# Patient Record
Sex: Male | Born: 1960 | Race: Black or African American | Hispanic: No | Marital: Married | Smoking: Never smoker
Health system: Southern US, Community
[De-identification: ages and names within clinical notes are randomized; demographics above are authoritative.]

## PROBLEM LIST (undated history)

## (undated) DIAGNOSIS — C801 Malignant (primary) neoplasm, unspecified: Secondary | ICD-10-CM

## (undated) DIAGNOSIS — I10 Essential (primary) hypertension: Secondary | ICD-10-CM

## (undated) DIAGNOSIS — E785 Hyperlipidemia, unspecified: Secondary | ICD-10-CM

## (undated) DIAGNOSIS — M109 Gout, unspecified: Secondary | ICD-10-CM

## (undated) DIAGNOSIS — M1990 Unspecified osteoarthritis, unspecified site: Secondary | ICD-10-CM

## (undated) DIAGNOSIS — Z86718 Personal history of other venous thrombosis and embolism: Secondary | ICD-10-CM

## (undated) HISTORY — DX: Essential (primary) hypertension: I10

## (undated) HISTORY — DX: Gout, unspecified: M10.9

## (undated) HISTORY — PX: KNEE ARTHROSCOPY: SUR90

## (undated) HISTORY — DX: Hyperlipidemia, unspecified: E78.5

---

## 2004-07-24 ENCOUNTER — Emergency Department: Payer: Self-pay | Admitting: General Practice

## 2006-06-06 ENCOUNTER — Emergency Department: Payer: Self-pay | Admitting: Emergency Medicine

## 2008-11-05 ENCOUNTER — Emergency Department: Payer: Self-pay | Admitting: Emergency Medicine

## 2009-07-07 ENCOUNTER — Emergency Department: Payer: Self-pay | Admitting: Internal Medicine

## 2009-10-21 ENCOUNTER — Emergency Department: Payer: Self-pay | Admitting: Emergency Medicine

## 2011-02-06 ENCOUNTER — Encounter: Payer: Self-pay | Admitting: Internal Medicine

## 2011-02-24 ENCOUNTER — Ambulatory Visit (AMBULATORY_SURGERY_CENTER): Payer: 59 | Admitting: *Deleted

## 2011-02-24 VITALS — Ht 69.0 in | Wt 233.0 lb

## 2011-02-24 DIAGNOSIS — Z1211 Encounter for screening for malignant neoplasm of colon: Secondary | ICD-10-CM

## 2011-02-24 MED ORDER — PEG-KCL-NACL-NASULF-NA ASC-C 100 G PO SOLR: ORAL | Status: DC

## 2011-02-24 NOTE — Progress Notes (Signed)
PCP Ronnald Collum at [facility],  [address] [place], [place]  [phone]

## 2011-02-25 ENCOUNTER — Encounter: Payer: Self-pay | Admitting: Internal Medicine

## 2011-03-03 ENCOUNTER — Ambulatory Visit (AMBULATORY_SURGERY_CENTER): Payer: 59 | Admitting: Internal Medicine

## 2011-03-03 ENCOUNTER — Encounter: Payer: Self-pay | Admitting: Internal Medicine

## 2011-03-03 VITALS — BP 122/81 | HR 54 | Temp 97.1°F | Resp 20 | Ht 69.0 in | Wt 235.0 lb

## 2011-03-03 DIAGNOSIS — K5730 Diverticulosis of large intestine without perforation or abscess without bleeding: Secondary | ICD-10-CM

## 2011-03-03 DIAGNOSIS — D126 Benign neoplasm of colon, unspecified: Secondary | ICD-10-CM

## 2011-03-03 DIAGNOSIS — Z1211 Encounter for screening for malignant neoplasm of colon: Secondary | ICD-10-CM

## 2011-03-03 MED ORDER — SODIUM CHLORIDE 0.9 % IV SOLN: 500.0000 mL | INTRAVENOUS | Status: DC

## 2011-03-03 NOTE — Patient Instructions (Signed)
FOLLOW DISCHARGE INSTRUCTIONS (BLUE & GREEN SHEETS)   INFORMATION GIVEN ON POLYPS, DIVERTICULOSIS, & HIGH FIBER DIET.

## 2011-03-03 NOTE — Progress Notes (Signed)
PT'S HEART RATE IS IN THE 50'S. PT STATES HE NORMALLY RUNS IN THE 60-70'S HE THOUGHT. NO PAIN OR DISCOMFORT. STATES HAS BEEN EXERCISING A LOT LATELY TO TRY TO HELP DECREASE HIS BLOOD PRESSURE. EWM  02 SAT DECREASED TO 88%- O2 INCREASED TO 3L. REPOSITIONED HEAD REMOVED PILLOW. SAT REMAINED 88 SO INCREASED TO 4L. REMOVED PILLOW, HAD PT TAKE DEEP BREATHES AND MD AWARE WITHOUT FURTHER ORDERS. EWM  SAT UP TO 91-93% ON 4L O2. EWM

## 2011-03-04 ENCOUNTER — Telehealth: Payer: Self-pay | Admitting: *Deleted

## 2011-03-04 NOTE — Telephone Encounter (Signed)
No answer on number left. Was voice mail that identified pt by first and last name so left message to call if problems or questions. EWM

## 2011-03-07 ENCOUNTER — Encounter: Payer: Self-pay | Admitting: Internal Medicine

## 2011-06-19 ENCOUNTER — Emergency Department: Payer: Self-pay | Admitting: Emergency Medicine

## 2012-06-23 ENCOUNTER — Emergency Department: Payer: Self-pay | Admitting: Emergency Medicine

## 2012-06-23 LAB — BASIC METABOLIC PANEL
Anion Gap: 9 (ref 7–16)
Calcium, Total: 9.6 mg/dL (ref 8.5–10.1)
Co2: 25 mmol/L (ref 21–32)
EGFR (African American): >60
EGFR (Non-African Amer.): >60
Glucose: 108 mg/dL — ABNORMAL HIGH (ref 65–99)
Osmolality: 282 (ref 275–301)

## 2012-06-23 LAB — URINALYSIS, COMPLETE
Bacteria: NONE SEEN
Glucose,UR: NEGATIVE mg/dL (ref 0–75)
Nitrite: NEGATIVE
Ph: 5 (ref 4.5–8.0)
Protein: 100
RBC,UR: 1 /HPF (ref 0–5)
Specific Gravity: 1.026 (ref 1.003–1.030)
Squamous Epithelial: NONE SEEN
WBC UR: 1 /HPF (ref 0–5)

## 2012-06-23 LAB — CBC
HCT: 47 % (ref 40.0–52.0)
HGB: 16.1 g/dL (ref 13.0–18.0)
MCHC: 34.2 g/dL (ref 32.0–36.0)
MCV: 84 fL (ref 80–100)
Platelet: 240 10*3/uL (ref 150–440)
RBC: 5.59 10*6/uL (ref 4.40–5.90)
RDW: 15.7 % — ABNORMAL HIGH (ref 11.5–14.5)

## 2013-03-28 ENCOUNTER — Emergency Department: Payer: Self-pay | Admitting: Emergency Medicine

## 2013-03-28 LAB — CBC
HCT: 45.8 % (ref 40.0–52.0)
HGB: 15.8 g/dL (ref 13.0–18.0)
MCH: 28.8 pg (ref 26.0–34.0)
MCV: 84 fL (ref 80–100)
Platelet: 245 10*3/uL (ref 150–440)
RBC: 5.47 10*6/uL (ref 4.40–5.90)
RDW: 15.5 % — ABNORMAL HIGH (ref 11.5–14.5)

## 2013-03-28 LAB — BASIC METABOLIC PANEL
BUN: 15 mg/dL (ref 7–18)
Chloride: 107 mmol/L (ref 98–107)
Co2: 23 mmol/L (ref 21–32)
EGFR (Non-African Amer.): >60
Osmolality: 276 (ref 275–301)
Potassium: 3.7 mmol/L (ref 3.5–5.1)

## 2013-03-28 LAB — URIC ACID: Uric Acid: 8.2 mg/dL — ABNORMAL HIGH (ref 3.5–7.2)

## 2013-04-19 ENCOUNTER — Ambulatory Visit (INDEPENDENT_AMBULATORY_CARE_PROVIDER_SITE_OTHER): Payer: 59

## 2013-04-19 ENCOUNTER — Encounter: Payer: Self-pay | Admitting: Podiatry

## 2013-04-19 ENCOUNTER — Ambulatory Visit (INDEPENDENT_AMBULATORY_CARE_PROVIDER_SITE_OTHER): Payer: 59 | Admitting: Podiatry

## 2013-04-19 VITALS — BP 147/101 | HR 70 | Temp 97.6°F | Resp 16 | Ht 69.0 in | Wt 234.2 lb

## 2013-04-19 DIAGNOSIS — M779 Enthesopathy, unspecified: Secondary | ICD-10-CM

## 2013-04-19 DIAGNOSIS — R52 Pain, unspecified: Secondary | ICD-10-CM

## 2013-04-19 DIAGNOSIS — R609 Edema, unspecified: Secondary | ICD-10-CM

## 2013-04-19 DIAGNOSIS — M109 Gout, unspecified: Secondary | ICD-10-CM

## 2013-04-19 MED ORDER — METHYLPREDNISOLONE 4 MG PO KIT: PACK | ORAL | Status: DC

## 2013-04-19 MED ORDER — TRIAMCINOLONE ACETONIDE 10 MG/ML IJ SUSP
5.0000 mg | Freq: Once | INTRAMUSCULAR | Status: AC
  Administered 2013-04-19: 5 mg via INTRA_ARTICULAR

## 2013-04-19 NOTE — Progress Notes (Signed)
N HURT/SWELL L LEFT FOOT THRU-OUT D 4 WEEKS O SUDDEN C WORSE A WALKING T TAKING INDOCIN, ICE PACK, SOAKS IN EPSON SALT

## 2013-04-19 NOTE — Progress Notes (Signed)
Subjective:     Patient ID: Kevin Lowery, male   DOB: February 28, 1961, 52 y.o.   MRN: [id number]  Foot Pain   patient states that he has had swelling in his left ankle which was moved into his foot. He went to the emergency room and they gave him indomethacin. He stated that he did have an attack like this one year ago which got better with indomethacin. He also had blood work done indicating elevated uric acid.   Review of Systems  All other systems reviewed and are negative.       Objective:   Physical Exam  Nursing note and vitals reviewed. Cardiovascular: Intact distal pulses.    Neurological found to be intact bilateral. Moderate edema noted in the left foot and ankle. Negative Homans sign was noted left upper and lower leg. No indications of systemic signs of clots. Tenderness noted in the sinus tarsi left and into the lateral ankle gutter. Muscle strength was reduced left side. The foot was warm bilateral. Range of motion reduced left side but appears to be splinting because of the pain that he is in.    Assessment:     Probable acute inflammatory condition left ankle. Maybe related to gout or other inflammatory condition. Cannot rule out clot but no clinical signs currently.    Plan:     H&P performed. X-ray reviewed with the patient. Today I injected the sinus tarsi and also dispensed air fracture walker to immobilize the foot and ankle. I gave him strict instructions of any swelling should occur in his leg or if he should develop any shortness of breath or pain to let us know immediately. Placed on a Medrol Dosepak. Reappoint 2 weeks

## 2013-04-19 NOTE — Patient Instructions (Signed)
D\c indomethacin and begin steroid pack. Wear boot as much as possible

## 2013-05-03 ENCOUNTER — Encounter: Payer: Self-pay | Admitting: Podiatry

## 2013-05-03 ENCOUNTER — Ambulatory Visit (INDEPENDENT_AMBULATORY_CARE_PROVIDER_SITE_OTHER): Payer: 59 | Admitting: Podiatry

## 2013-05-03 VITALS — BP 136/88 | HR 84 | Resp 16 | Ht 69.0 in | Wt 235.0 lb

## 2013-05-03 DIAGNOSIS — M069 Rheumatoid arthritis, unspecified: Secondary | ICD-10-CM

## 2013-05-03 DIAGNOSIS — M109 Gout, unspecified: Secondary | ICD-10-CM

## 2013-05-03 DIAGNOSIS — M779 Enthesopathy, unspecified: Secondary | ICD-10-CM

## 2013-05-03 MED ORDER — METHYLPREDNISOLONE 4 MG PO KIT: PACK | ORAL | Status: DC

## 2013-05-03 MED ORDER — TRIAMCINOLONE ACETONIDE 10 MG/ML IJ SUSP
5.0000 mg | Freq: Once | INTRAMUSCULAR | Status: AC
  Administered 2013-05-03: 5 mg via INTRA_ARTICULAR

## 2013-05-03 NOTE — Progress Notes (Signed)
Subjective:     Patient ID: Kevin Lowery, male   DOB: 12-11-1960, 52 y.o.   MRN: [id number]  HPI patient states my foot still hurts not as badly as it did but still sore and also my right elbow is tender and my big toe joint is tender. Left foot is where most symptoms have been   Review of Systems  All other systems reviewed and are negative.       Objective:   Physical Exam  Nursing note and vitals reviewed. Constitutional: He appears well-developed and well-nourished.  Cardiovascular: Intact distal pulses.   Musculoskeletal: Normal range of motion.  Neurological: He is alert.  Skin: Skin is warm.   patient continues to have discomfort in the lateral side of the left ankle mostly in the ankle gutter with the sinus tarsi been improved. Mild edema noted in the forefoot and edema of the right elbow noted     Assessment:     Cannot judge whether this may be systemic gout or possible arthritic condition versus localized inflammation    Plan:     Patient will begin allopurinol and was placed on a Nutter six-day steroid dose pack. I am sending for full arthritic profile do 2 symptoms the way they are I injected the lateral ankle gutter 3 mg Kenalog 5 mg Xylocaine Marcaine mixture

## 2013-05-31 ENCOUNTER — Ambulatory Visit: Payer: 59 | Admitting: Podiatry

## 2013-12-23 ENCOUNTER — Emergency Department: Payer: Self-pay | Admitting: Emergency Medicine

## 2013-12-23 LAB — COMPREHENSIVE METABOLIC PANEL
ALBUMIN: 3.8 g/dL (ref 3.4–5.0)
Alkaline Phosphatase: 65 U/L
Anion Gap: 8 (ref 7–16)
BUN: 14 mg/dL (ref 7–18)
Bilirubin,Total: 0.9 mg/dL (ref 0.2–1.0)
CALCIUM: 9.3 mg/dL (ref 8.5–10.1)
CO2: 26 mmol/L (ref 21–32)
Chloride: 105 mmol/L (ref 98–107)
Creatinine: 1.08 mg/dL (ref 0.60–1.30)
EGFR (African American): >60
Glucose: 109 mg/dL — ABNORMAL HIGH (ref 65–99)
OSMOLALITY: 279 (ref 275–301)
POTASSIUM: 3.8 mmol/L (ref 3.5–5.1)
SGOT(AST): 28 U/L (ref 15–37)
SGPT (ALT): 28 U/L (ref 12–78)
SODIUM: 139 mmol/L (ref 136–145)
Total Protein: 7.7 g/dL (ref 6.4–8.2)

## 2013-12-23 LAB — CBC
HCT: 46.6 % (ref 40.0–52.0)
HGB: 15.5 g/dL (ref 13.0–18.0)
MCH: 28 pg (ref 26.0–34.0)
MCHC: 33.2 g/dL (ref 32.0–36.0)
MCV: 84 fL (ref 80–100)
PLATELETS: 264 10*3/uL (ref 150–440)
RBC: 5.53 10*6/uL (ref 4.40–5.90)
RDW: 15.9 % — ABNORMAL HIGH (ref 11.5–14.5)
WBC: 7.2 10*3/uL (ref 3.8–10.6)

## 2014-09-24 ENCOUNTER — Emergency Department: Payer: Self-pay | Admitting: Emergency Medicine

## 2016-01-30 ENCOUNTER — Encounter: Payer: Self-pay | Admitting: Internal Medicine

## 2017-09-18 ENCOUNTER — Emergency Department (HOSPITAL_COMMUNITY)
Admission: EM | Admit: 2017-09-18 | Discharge: 2017-09-18 | Disposition: A | Discharge status: Home / Self Care | Payer: BLUE CROSS/BLUE SHIELD | Attending: Emergency Medicine | Admitting: Emergency Medicine

## 2017-09-18 ENCOUNTER — Encounter (HOSPITAL_COMMUNITY): Payer: Self-pay | Admitting: Emergency Medicine

## 2017-09-18 ENCOUNTER — Other Ambulatory Visit: Payer: Self-pay

## 2017-09-18 DIAGNOSIS — M5412 Radiculopathy, cervical region: Secondary | ICD-10-CM | POA: Diagnosis not present

## 2017-09-18 DIAGNOSIS — Z79899 Other long term (current) drug therapy: Secondary | ICD-10-CM | POA: Diagnosis not present

## 2017-09-18 DIAGNOSIS — I10 Essential (primary) hypertension: Secondary | ICD-10-CM | POA: Diagnosis not present

## 2017-09-18 DIAGNOSIS — M542 Cervicalgia: Secondary | ICD-10-CM | POA: Diagnosis present

## 2017-09-18 HISTORY — DX: Unspecified osteoarthritis, unspecified site: M19.90

## 2017-09-18 LAB — COMPREHENSIVE METABOLIC PANEL
ALT: 43 U/L (ref 17–63)
AST: 33 U/L (ref 15–41)
Albumin: 3.8 g/dL (ref 3.5–5.0)
Alkaline Phosphatase: 166 U/L — ABNORMAL HIGH (ref 38–126)
Anion gap: 12 (ref 5–15)
BUN: 18 mg/dL (ref 6–20)
CALCIUM: 9.7 mg/dL (ref 8.9–10.3)
CHLORIDE: 103 mmol/L (ref 101–111)
CO2: 22 mmol/L (ref 22–32)
CREATININE: 1.18 mg/dL (ref 0.61–1.24)
Glucose, Bld: 101 mg/dL — ABNORMAL HIGH (ref 65–99)
Potassium: 3.6 mmol/L (ref 3.5–5.1)
Sodium: 137 mmol/L (ref 135–145)
TOTAL PROTEIN: 8.1 g/dL (ref 6.5–8.1)
Total Bilirubin: 1 mg/dL (ref 0.3–1.2)

## 2017-09-18 LAB — CBC WITH DIFFERENTIAL/PLATELET
Basophils Absolute: 0 10*3/uL (ref 0.0–0.1)
Basophils Relative: 0 %
Eosinophils Absolute: 0.2 10*3/uL (ref 0.0–0.7)
Eosinophils Relative: 3 %
HCT: 43.1 % (ref 39.0–52.0)
Hemoglobin: 14.2 g/dL (ref 13.0–17.0)
LYMPHS ABS: 2.1 10*3/uL (ref 0.7–4.0)
LYMPHS PCT: 30 %
MCH: 27.3 pg (ref 26.0–34.0)
MCHC: 32.9 g/dL (ref 30.0–36.0)
MCV: 82.7 fL (ref 78.0–100.0)
MONO ABS: 0.4 10*3/uL (ref 0.1–1.0)
Monocytes Relative: 6 %
Neutro Abs: 4.4 10*3/uL (ref 1.7–7.7)
Neutrophils Relative %: 61 %
Platelets: 314 10*3/uL (ref 150–400)
RBC: 5.21 MIL/uL (ref 4.22–5.81)
RDW: 17.1 % — ABNORMAL HIGH (ref 11.5–15.5)
WBC: 7 10*3/uL (ref 4.0–10.5)

## 2017-09-18 LAB — I-STAT TROPONIN, ED: TROPONIN I, POC: 0 ng/mL (ref 0.00–0.08)

## 2017-09-18 MED ORDER — PREDNISONE 50 MG PO TABS: 50.0000 mg | ORAL_TABLET | Freq: Every day | ORAL | 0 refills | Status: DC

## 2017-09-18 MED ORDER — TRAMADOL HCL 50 MG PO TABS: 50.0000 mg | ORAL_TABLET | Freq: Four times a day (QID) | ORAL | 0 refills | Status: DC | PRN

## 2017-09-18 MED ORDER — CYCLOBENZAPRINE HCL 10 MG PO TABS: 10.0000 mg | ORAL_TABLET | Freq: Every day | ORAL | 0 refills | Status: DC

## 2017-09-18 NOTE — Discharge Instructions (Signed)
Return here as needed. Follow up with the Doctor provided. Use ice and heat on your neck.

## 2017-09-18 NOTE — ED Triage Notes (Signed)
Patient reports persistent left lateral neck pain radiating down to left arm for 4 days , denies injury , no chest pain or SOB .

## 2017-09-20 NOTE — ED Provider Notes (Signed)
[facility] EMERGENCY DEPARTMENT Provider Note   CSN: [id number] Arrival date & time: 09/18/17  6295     History   Chief Complaint Chief Complaint  Patient presents with  . Neck Pain    Radiating to Arm    HPI Kevin Lowery is a 57 y.o. male.  HPI Patient presents to the emergency department with left-sided neck pain that radiates into the left upper extremity.  Patient states that he has had this ongoing for several weeks and was seen previously and had x-rays performed.  He states that the medications that were prescribed did not seem to help with his issue.  Patient states that certain movements and palpation make the pain worse.  Patient does not recall any specific injury.  The patient denies chest pain, shortness of breath, headache,blurred vision,  fever, cough, weakness, numbness, dizziness, anorexia, edema, abdominal pain, nausea, vomiting, diarrhea, rash, back pain, dysuria, hematemesis, bloody stool, near syncope, or syncope. Past Medical History:  Diagnosis Date  . Arthritis   . Gout   . Gout   . Hyperlipidemia   . Hypertension     There are no active problems to display for this patient.   Past Surgical History:  Procedure Laterality Date  . KNEE ARTHROSCOPY     right       Home Medications    Prior to Admission medications   Medication Sig Start Date End Date Taking? Authorizing Provider  allopurinol (ZYLOPRIM) 300 MG tablet Take 450 mg by mouth daily. 08/17/17  Yes [provider]  atenolol (TENORMIN) 100 MG tablet Take 100 mg by mouth daily.  01/26/11  Yes [provider]  AZOR 10-40 MG per tablet Take 1 tablet by mouth Daily. 01/26/11  Yes [provider]  meloxicam (MOBIC) 15 MG tablet Take 15 mg by mouth daily. 09/13/17  Yes [provider]  cyclobenzaprine (FLEXERIL) 10 MG tablet Take 1 tablet (10 mg total) by mouth at bedtime. 09/18/17   Keyton Bhat, Harrell Gave, PA-C  methylPREDNISolone (MEDROL DOSEPAK)  4 MG tablet follow package directions Patient not taking: Reported on 09/18/2017 05/03/13   Wallene Huh, DPM  methylPREDNISolone (MEDROL DOSEPAK) 4 MG tablet follow package directions Patient not taking: Reported on 09/18/2017 05/03/13   Wallene Huh, DPM  predniSONE (DELTASONE) 50 MG tablet Take 1 tablet (50 mg total) by mouth daily with breakfast. 09/18/17   Kurtiss Wence, Harrell Gave, PA-C  traMADol (ULTRAM) 50 MG tablet Take 1 tablet (50 mg total) by mouth every 6 (six) hours as needed for severe pain. 09/18/17   Dalia Heading, PA-C    Family History Family History  Problem Relation Age of Onset  . Prostate cancer Father   . Stroke Father   . Seizures Father   . Diabetes Mother   . Cancer Mother     Social History Social History   Tobacco Use  . Smoking status: Never Smoker  . Smokeless tobacco: Never Used  Substance Use Topics  . Alcohol use: No  . Drug use: No     Allergies   Hydrocodone and Oxycodone   Review of Systems Review of Systems All other systems negative except as documented in the HPI. All pertinent positives and negatives as reviewed in the HPI.  Physical Exam Updated Vital Signs BP (!) 140/97   Pulse 72   Temp 97.6 F (36.4 C) (Oral)   Resp 14   SpO2 96%   Physical Exam  Constitutional: He is oriented to person, place, and time.  He appears well-developed and well-nourished. No distress.  HENT:  Head: Normocephalic and atraumatic.  Eyes: Pupils are equal, round, and reactive to light.  Pulmonary/Chest: Effort normal.  Musculoskeletal:       Cervical back: He exhibits tenderness and pain. He exhibits normal range of motion, no bony tenderness, no swelling, no edema, no deformity and no spasm.       Back:  Neurological: He is alert and oriented to person, place, and time. He displays normal reflexes. No sensory deficit. He exhibits normal muscle tone. Coordination normal.  Skin: Skin is warm and dry.  Psychiatric: He has a normal mood and  affect.  Nursing note and vitals reviewed.    ED Treatments / Results  Labs (all labs ordered are listed, but only abnormal results are displayed) Labs Reviewed  CBC WITH DIFFERENTIAL/PLATELET - Abnormal; Notable for the following components:      Result Value   RDW 17.1 (*)    All other components within normal limits  COMPREHENSIVE METABOLIC PANEL - Abnormal; Notable for the following components:   Glucose, Bld 101 (*)    Alkaline Phosphatase 166 (*)    All other components within normal limits  I-STAT TROPONIN, ED    EKG  EKG Interpretation  Date/Time:  Friday September 18 2017 04:01:14 EST Ventricular Rate:  75 PR Interval:  168 QRS Duration: 82 QT Interval:  400 QTC Calculation: 446 R Axis:   27 Text Interpretation:  Normal sinus rhythm Possible Inferior infarct , age undetermined Abnormal ECG No significant change since last tracing Confirmed by Dorie Rank [phone]) on 09/19/2017 10:18:40 AM       Radiology No results found.  Procedures Procedures (including critical care time)  Medications Ordered in ED Medications - No data to display   Initial Impression / Assessment and Plan / ED Course  I have reviewed the triage vital signs and the nursing notes.  Pertinent labs & imaging results that were available during my care of the patient were reviewed by me and considered in my medical decision making (see chart for details).     Has what I feel is cervical radiculopathy and will need further evaluation and follow-up the patient has an x-ray that shows degenerative changes in the neck.  I did prescribe prednisone and muscle relaxant along with a pain medication.  Patient is advised to return here as needed patient agrees the plan and all questions were answered he has no neuro deficits noted on exam he has normal strength in his upper extremities. Final Clinical Impressions(s) / ED Diagnoses   Final diagnoses:  Cervical radiculopathy    ED Discharge Orders         Ordered    predniSONE (DELTASONE) 50 MG tablet  Daily with breakfast     09/18/17 0747    cyclobenzaprine (FLEXERIL) 10 MG tablet  Daily at bedtime     09/18/17 0747    traMADol (ULTRAM) 50 MG tablet  Every 6 hours PRN     09/18/17 0747       Dalia Heading, PA-C 09/20/17 9242    Veryl Speak, MD 09/25/17 [phone]

## 2017-10-05 ENCOUNTER — Other Ambulatory Visit: Payer: Self-pay | Admitting: Student

## 2017-10-05 DIAGNOSIS — M5412 Radiculopathy, cervical region: Secondary | ICD-10-CM

## 2017-10-13 ENCOUNTER — Ambulatory Visit
Admission: RE | Admit: 2017-10-13 | Discharge: 2017-10-13 | Disposition: A | Discharge status: Home / Self Care | Payer: BLUE CROSS/BLUE SHIELD | Source: Ambulatory Visit | Attending: Student | Admitting: Student

## 2017-10-13 DIAGNOSIS — M5412 Radiculopathy, cervical region: Secondary | ICD-10-CM | POA: Diagnosis present

## 2017-10-13 DIAGNOSIS — M4722 Other spondylosis with radiculopathy, cervical region: Secondary | ICD-10-CM | POA: Insufficient documentation

## 2018-07-03 ENCOUNTER — Emergency Department: Payer: BLUE CROSS/BLUE SHIELD

## 2018-07-03 ENCOUNTER — Emergency Department
Admission: EM | Admit: 2018-07-03 | Discharge: 2018-07-03 | Disposition: A | Discharge status: Home / Self Care | Payer: BLUE CROSS/BLUE SHIELD | Attending: Emergency Medicine | Admitting: Emergency Medicine

## 2018-07-03 ENCOUNTER — Other Ambulatory Visit: Payer: Self-pay

## 2018-07-03 DIAGNOSIS — I10 Essential (primary) hypertension: Secondary | ICD-10-CM | POA: Insufficient documentation

## 2018-07-03 DIAGNOSIS — Z79899 Other long term (current) drug therapy: Secondary | ICD-10-CM | POA: Insufficient documentation

## 2018-07-03 DIAGNOSIS — J9811 Atelectasis: Secondary | ICD-10-CM

## 2018-07-03 DIAGNOSIS — R1110 Vomiting, unspecified: Secondary | ICD-10-CM | POA: Insufficient documentation

## 2018-07-03 DIAGNOSIS — R05 Cough: Secondary | ICD-10-CM | POA: Diagnosis present

## 2018-07-03 MED ORDER — METHYLPREDNISOLONE 4 MG PO TBPK: ORAL_TABLET | ORAL | 0 refills | Status: DC

## 2018-07-03 MED ORDER — IPRATROPIUM-ALBUTEROL 0.5-2.5 (3) MG/3ML IN SOLN
3.0000 mL | Freq: Once | RESPIRATORY_TRACT | Status: AC
  Administered 2018-07-03: 3 mL via RESPIRATORY_TRACT
  Filled 2018-07-03: qty 3

## 2018-07-03 MED ORDER — GUAIFENESIN ER 600 MG PO TB12: 600.0000 mg | ORAL_TABLET | Freq: Two times a day (BID) | ORAL | 2 refills | Status: AC

## 2018-07-03 MED ORDER — DEXAMETHASONE SODIUM PHOSPHATE 10 MG/ML IJ SOLN
10.0000 mg | Freq: Once | INTRAMUSCULAR | Status: AC
  Administered 2018-07-03: 10 mg via INTRAMUSCULAR
  Filled 2018-07-03: qty 1

## 2018-07-03 NOTE — ED Provider Notes (Addendum)
[facility] Emergency Department Provider Note   ____________________________________________   First MD Initiated Contact with Patient 07/03/18 1357     (approximate)  I have reviewed the triage vital signs and the nursing notes.   HISTORY  Chief Complaint Cough     HPI Kevin Lowery is a 57 y.o. male patient presents with coughing for 1 month.  Patient state he has been seen by his PCP in urgent care clinic without resolution of the cough.  Patient stated past 2 days cough has become more productive for greenish phlegm.  Patient state coughing spell leads to episodes of vomiting in the past 24 hours.  Patient denies chest pain or dyspnea.  Patient state in the past has been fever and chills but none at present.  No palliative measures for complaint in the past week.  Past Medical History:  Diagnosis Date  . Arthritis   . Gout   . Gout   . Hyperlipidemia   . Hypertension     There are no active problems to display for this patient.   Past Surgical History:  Procedure Laterality Date  . KNEE ARTHROSCOPY     right    Prior to Admission medications   Medication Sig Start Date End Date Taking? Authorizing Provider  allopurinol (ZYLOPRIM) 300 MG tablet Take 450 mg by mouth daily. 08/17/17   [provider]  atenolol (TENORMIN) 100 MG tablet Take 100 mg by mouth daily.  01/26/11   [provider]  AZOR 10-40 MG per tablet Take 1 tablet by mouth Daily. 01/26/11   [provider]  cyclobenzaprine (FLEXERIL) 10 MG tablet Take 1 tablet (10 mg total) by mouth at bedtime. 09/18/17   Lawyer, Harrell Gave, PA-C  guaiFENesin (MUCINEX) 600 MG 12 hr tablet Take 1 tablet (600 mg total) by mouth 2 (two) times daily. 07/03/18 07/03/19  Sable Feil, PA-C  meloxicam (MOBIC) 15 MG tablet Take 15 mg by mouth daily. 09/13/17   [provider]  methylPREDNISolone (MEDROL DOSEPAK) 4 MG tablet follow package directions Patient not taking:  Reported on 09/18/2017 05/03/13   Wallene Huh, DPM  methylPREDNISolone (MEDROL DOSEPAK) 4 MG tablet follow package directions Patient not taking: Reported on 09/18/2017 05/03/13   Wallene Huh, DPM  methylPREDNISolone (MEDROL DOSEPAK) 4 MG TBPK tablet Take Tapered dose as directed 07/03/18   Sable Feil, PA-C  predniSONE (DELTASONE) 50 MG tablet Take 1 tablet (50 mg total) by mouth daily with breakfast. 09/18/17   Lawyer, Harrell Gave, PA-C  traMADol (ULTRAM) 50 MG tablet Take 1 tablet (50 mg total) by mouth every 6 (six) hours as needed for severe pain. 09/18/17   Lawyer, Harrell Gave, PA-C    Allergies Hydrocodone and Oxycodone  Family History  Problem Relation Age of Onset  . Prostate cancer Father   . Stroke Father   . Seizures Father   . Diabetes Mother   . Cancer Mother     Social History Social History   Tobacco Use  . Smoking status: Never Smoker  . Smokeless tobacco: Never Used  Substance Use Topics  . Alcohol use: No  . Drug use: No    Review of Systems Constitutional: No fever/chills Eyes: No visual changes. ENT: No sore throat. Cardiovascular: Denies chest pain. Respiratory: Denies shortness of breath.  Productive cough. Gastrointestinal: No abdominal pain.  No nausea.  Vomiting secondary to coughing.  No diarrhea.  No constipation. Genitourinary: Negative for dysuria. Musculoskeletal: Negative for back pain. Skin: Negative for  rash. Neurological: Negative for headaches, focal weakness or numbness. Endocrine:Hyperlipidemia, hypertension, and gout. Allergic/Immunilogical: Hydrocodone and oxycodone. ____________________________________________   PHYSICAL EXAM:  VITAL SIGNS: ED Triage Vitals [07/03/18 1342]  Enc Vitals Group     BP (!) 153/84     Pulse Rate 80     Resp 16     Temp 98.2 F (36.8 C)     Temp Source Oral     SpO2 98 %     Weight 230 lb (104.3 kg)     Height 5\' 9"  (1.753 m)     Head Circumference      Peak Flow      Pain Score 5       Pain Loc      Pain Edu?      Excl. in [place]?    Constitutional: Alert and oriented. Well appearing and in no acute distress. Neck: No stridor.   Cardiovascular: Normal rate, regular rhythm. Grossly normal heart sounds.  Good peripheral circulation. Respiratory: Normal respiratory effort.  No retractions. Lungs CTAB. Skin:  Skin is warm, dry and intact. No rash noted. Psychiatric: Mood and affect are normal. Speech and behavior are normal.  ____________________________________________   LABS (all labs ordered are listed, but only abnormal results are displayed)  Labs Reviewed - No data to display ____________________________________________  EKG   ____________________________________________  RADIOLOGY  ED MD interpretation:    Official radiology report(s): Dg Chest 2 View  Result Date: 07/03/2018 CLINICAL DATA:  Cough, chest pain, hypertension EXAM: CHEST - 2 VIEW COMPARISON:  None. FINDINGS: Low lung volumes with basilar atelectasis versus scarring. Normal heart size and vascularity. No focal pneumonia, collapse or consolidation. Negative for edema, effusion or pneumothorax. Trachea is midline. Aorta atherosclerotic. IMPRESSION: Low lung volumes with basilar atelectasis. Electronically Signed   By: Jerilynn Mages.  Shick M.D.   On: 07/03/2018 14:59    ____________________________________________   PROCEDURES  Procedure(s) performed: None  Procedures  Critical Care performed: No  ____________________________________________   INITIAL IMPRESSION / ASSESSMENT AND PLAN / ED COURSE  As part of my medical decision making, I reviewed the following data within the [facility]   Patient presents with productive cough for 1 month.  Denies fever chills associated with complaint.  Chest x-ray shows bilateral basilar atelectasis.  Patient given discharge care instruction advised take medication as directed.  Patient advised to follow-up with PCP in 1 week.  Return to ED  if condition worsens.       ____________________________________________   FINAL CLINICAL IMPRESSION(S) / ED DIAGNOSES  Final diagnoses:  Atelectasis, bilateral     ED Discharge Orders         Ordered    methylPREDNISolone (MEDROL DOSEPAK) 4 MG TBPK tablet     07/03/18 1525    guaiFENesin (MUCINEX) 600 MG 12 hr tablet  2 times daily     07/03/18 1525           Note:  This document was prepared using Dragon voice recognition software and may include unintentional dictation errors.    Sable Feil, PA-C 07/03/18 1530    Sable Feil, PA-C 07/03/18 1531    Delman Kitten, MD 07/03/18 [phone]

## 2018-07-03 NOTE — ED Triage Notes (Signed)
productive cough x 1 month. Denies fever. A&O, ambulatory.

## 2018-07-28 ENCOUNTER — Other Ambulatory Visit: Payer: Self-pay

## 2018-07-28 ENCOUNTER — Emergency Department
Admission: EM | Admit: 2018-07-28 | Discharge: 2018-07-29 | Disposition: A | Discharge status: Home / Self Care | Payer: BLUE CROSS/BLUE SHIELD | Attending: Emergency Medicine | Admitting: Emergency Medicine

## 2018-07-28 ENCOUNTER — Emergency Department: Payer: BLUE CROSS/BLUE SHIELD

## 2018-07-28 DIAGNOSIS — Z79899 Other long term (current) drug therapy: Secondary | ICD-10-CM | POA: Diagnosis not present

## 2018-07-28 DIAGNOSIS — I10 Essential (primary) hypertension: Secondary | ICD-10-CM | POA: Diagnosis not present

## 2018-07-28 DIAGNOSIS — R059 Cough, unspecified: Secondary | ICD-10-CM

## 2018-07-28 DIAGNOSIS — R0789 Other chest pain: Secondary | ICD-10-CM | POA: Diagnosis not present

## 2018-07-28 DIAGNOSIS — R05 Cough: Secondary | ICD-10-CM

## 2018-07-28 HISTORY — DX: Personal history of other venous thrombosis and embolism: Z86.718

## 2018-07-28 NOTE — ED Triage Notes (Signed)
Pt in with co cough for a month, was seen here 3 weeks ago for the same. Was put on prednisone and told to take otc cough meds. States cough has been persistent and is having right rib pain and chest soreness on inspiration and movement.

## 2018-07-28 NOTE — ED Provider Notes (Signed)
[facility] Emergency Department Provider Note  ____________________________________________   First MD Initiated Contact with Patient 07/28/18 2326     (approximate)  I have reviewed the triage vital signs and the nursing notes.   HISTORY  Chief Complaint Cough    HPI Kevin Lowery is a 58 y.o. 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  He presents for evaluation of persistent cough for almost 2 months.  He was seen about a month ago and diagnosed with and treated for bronchitis.  However he says that his cough has persisted.  Over the last several days it has gotten a little bit worse and he has been having intermittent episodes of shortness of breath not associated with a cough and not associated with exertion.  He also reports having some pain in the right side of his chest that is worse when he takes a deep breath and when he coughs.  He can slightly reproduce the pain with pressing on his right anterior lower ribs, but mostly it is reproduced with deep breaths and coughing.  He denies fever/chills, nausea, vomiting, abdominal pain, dysuria, leg pain or swelling, dizziness, lightheadedness, or passing out.  Over the course of the discussion of his past medical history he reported that years ago he had a spontaneous DVT in the left lower extremity.  No one was able to tell him why he got it and he was on anticoagulation until a repeat ultrasound demonstrated that the clot had resolved.  He reports that the cough is moderate, the pain is moderate, and nothing in particular makes it better and is made worse as described above.  Past Medical History:  Diagnosis Date  . Arthritis   . Gout   . Gout   . History of DVT of lower extremity    Anticoagulated for a couple of months  . Hyperlipidemia   . Hypertension     There are no active problems to display for this  patient.   Past Surgical History:  Procedure Laterality Date  . KNEE ARTHROSCOPY     right    Prior to Admission medications   Medication Sig Start Date End Date Taking? Authorizing Provider  allopurinol (ZYLOPRIM) 300 MG tablet Take 450 mg by mouth daily. 08/17/17   [provider]  atenolol (TENORMIN) 100 MG tablet Take 100 mg by mouth daily.  01/26/11   [provider]  AZOR 10-40 MG per tablet Take 1 tablet by mouth Daily. 01/26/11   [provider]  benzonatate (TESSALON PERLES) 100 MG capsule Take 1 capsule (100 mg total) by mouth 3 (three) times daily as needed for cough. 07/29/18   Hinda Kehr, MD  cyclobenzaprine (FLEXERIL) 10 MG tablet Take 1 tablet (10 mg total) by mouth at bedtime. 09/18/17   Lawyer, Harrell Gave, PA-C  guaiFENesin (MUCINEX) 600 MG 12 hr tablet Take 1 tablet (600 mg total) by mouth 2 (two) times daily. 07/03/18 07/03/19  Sable Feil, PA-C  meloxicam (MOBIC) 15 MG tablet Take 15 mg by mouth daily. 09/13/17   [provider]  methylPREDNISolone (MEDROL DOSEPAK) 4 MG tablet follow package directions Patient not taking: Reported on 09/18/2017 05/03/13   Wallene Huh, DPM  methylPREDNISolone (MEDROL DOSEPAK) 4 MG tablet follow package directions Patient not taking: Reported on 09/18/2017 05/03/13   Wallene Huh, DPM  methylPREDNISolone (MEDROL DOSEPAK) 4 MG TBPK tablet Take Tapered dose as directed  07/03/18   Sable Feil, PA-C  predniSONE (DELTASONE) 50 MG tablet Take 1 tablet (50 mg total) by mouth daily with breakfast. 09/18/17   Lawyer, Harrell Gave, PA-C  traMADol (ULTRAM) 50 MG tablet Take 1 tablet (50 mg total) by mouth every 6 (six) hours as needed for severe pain. 09/18/17   Lawyer, Harrell Gave, PA-C    Allergies Hydrocodone and Oxycodone  Family History  Problem Relation Age of Onset  . Prostate cancer Father   . Stroke Father   . Seizures Father   . Diabetes Mother   . Cancer Mother     Social History Social  History   Tobacco Use  . Smoking status: Never Smoker  . Smokeless tobacco: Never Used  Substance Use Topics  . Alcohol use: No  . Drug use: No    Review of Systems Constitutional: No fever/chills Eyes: No visual changes. ENT: No sore throat. Cardiovascular: Right-sided chest wall pain with coughing and deep inspiration. Respiratory: Intermittent shortness of breath with persistent cough x2 months as described above Gastrointestinal: No abdominal pain.  No nausea, no vomiting.  No diarrhea.  No constipation. Genitourinary: Negative for dysuria. Musculoskeletal: No leg pain or swelling.  Negative for neck pain.  Negative for back pain. Integumentary: Negative for rash. Neurological: Negative for headaches, focal weakness or numbness.   ____________________________________________   PHYSICAL EXAM:  VITAL SIGNS: ED Triage Vitals  Enc Vitals Group     BP 07/28/18 2109 (!) 136/96     Pulse Rate 07/28/18 2109 86     Resp 07/28/18 2109 20     Temp 07/28/18 2109 98.3 F (36.8 C)     Temp Source 07/28/18 2109 Oral     SpO2 07/28/18 2109 97 %     Weight 07/28/18 2110 104.3 kg (230 lb)     Height --      Head Circumference --      Peak Flow --      Pain Score 07/28/18 2109 4     Pain Loc --      Pain Edu? --      Excl. in [place]? --     Constitutional: Alert and oriented. Well appearing and in no acute distress. Eyes: Conjunctivae are normal.  Head: Atraumatic. Nose: No congestion/rhinnorhea. Mouth/Throat: Mucous membranes are moist. Neck: No stridor.  No meningeal signs.   Cardiovascular: Normal rate, regular rhythm. Good peripheral circulation. Grossly normal heart sounds. Respiratory: Normal respiratory effort.  No retractions. Lungs CTAB. Gastrointestinal: Soft and nontender. No distention.  Musculoskeletal: No clinically significant chest wall tenderness to palpation to the anterior lower right ribs in spite of the patient pointing at the area that he feels the pain  when he takes a deep breath.  No lower extremity tenderness nor edema. No gross deformities of extremities. Neurologic:  Normal speech and language. No gross focal neurologic deficits are appreciated.  Skin:  Skin is warm, dry and intact. No rash noted. Psychiatric: Mood and affect are normal. Speech and behavior are normal.  ____________________________________________   LABS (all labs ordered are listed, but only abnormal results are displayed)  Labs Reviewed  CBC WITH DIFFERENTIAL/PLATELET - Abnormal; Notable for the following components:      Result Value   RDW 17.5 (*)    All other components within normal limits  BASIC METABOLIC PANEL - Abnormal; Notable for the following components:   Glucose, Bld 106 (*)    All other components within normal limits   ____________________________________________  EKG  None -  EKG not ordered by ED physician ____________________________________________  RADIOLOGY I, Hinda Kehr, personally viewed and evaluated these images (plain radiographs) as part of my medical decision making, as well as reviewing the written report by the radiologist.  ED MD interpretation: Bibasilar atelectasis versus scarring without any other obvious intrathoracic abnormality on chest x-ray.  No acute abnormality identified on CTA chest including no evidence of interstitial pneumonia nor pulmonary embolism.  Official radiology report(s): Dg Chest 2 View  Result Date: 07/28/2018 CLINICAL DATA:  58 year old male with persistent cough for 1 month. Right rib pain, chest soreness. EXAM: CHEST - 2 VIEW COMPARISON:  07/03/2018 chest radiographs. FINDINGS: Lower lung volumes on both views compared to December with curvilinear bibasilar opacity most resembling atelectasis and/or scarring. No pneumothorax, pulmonary edema or pleural effusion. No consolidation identified. Mediastinal contours remain within normal limits. Visualized tracheal air column is within normal limits. No  acute osseous abnormality identified. Negative visible bowel gas pattern. IMPRESSION: Lower lung volumes. Bibasilar atelectasis versus scarring. No other cardiopulmonary abnormality identified. Electronically Signed   By: Genevie Ann M.D.   On: 07/28/2018 21:27   Ct Angio Chest Pe W/cm &/or Wo Cm  Result Date: 07/29/2018 CLINICAL DATA:  Cough for a month. Seen 3 weeks ago for same. Persistent cough with right rib pain and chest soreness on inspiration. EXAM: CT ANGIOGRAPHY CHEST WITH CONTRAST TECHNIQUE: Multidetector CT imaging of the chest was performed using the standard protocol during bolus administration of intravenous contrast. Multiplanar CT image reconstructions and MIPs were obtained to evaluate the vascular anatomy. CONTRAST:  28mL OMNIPAQUE IOHEXOL 350 MG/ML SOLN COMPARISON:  None. FINDINGS: Cardiovascular: Good opacification of the central and segmental pulmonary arteries. No focal filling defects. No evidence of significant pulmonary embolus. Normal heart size. No pericardial effusions. Normal caliber thoracic aorta. No aortic dissection. Great vessel origins are patent. Mediastinum/Nodes: Esophagus is decompressed. Small esophageal hiatal hernia. Mild lower esophageal wall thickening may indicate reflux disease. Mild prominence of right cardiophrenic angle lymph nodes without pathologic enlargement, likely reactive. No significant lymphadenopathy. Lungs/Pleura: Mild dependent atelectasis in the lung bases. Lungs are otherwise clear. No airspace disease or consolidation is suggested. No pleural effusions. No pneumothorax. Upper Abdomen: No acute changes demonstrated in the upper abdomen. Musculoskeletal: No chest wall abnormality. No acute or significant osseous findings. Review of the MIP images confirms the above findings. IMPRESSION: No evidence of significant pulmonary embolus. No evidence of active pulmonary disease. Electronically Signed   By: Lucienne Capers M.D.   On: 07/29/2018 01:19     ____________________________________________   PROCEDURES  Critical Care performed: No   Procedure(s) performed:   Procedures   ____________________________________________   INITIAL IMPRESSION / ASSESSMENT AND PLAN / ED COURSE  As part of my medical decision making, I reviewed the following data within the [place] notes reviewed and incorporated, Labs reviewed , Old chart reviewed, Radiograph reviewed  and Notes from prior ED visits    Differential diagnosis includes, but is not limited to, persistent nonspecific viral cough, community-acquired pneumonia, pulmonary embolism, ACS.  The patient signs and symptoms are not at all consistent with ACS and he is low risk based on HEART score.  However, his symptoms are somewhat concerning for pulmonary embolism given his history of spontaneous DVT requiring prior anticoagulation, the persistent cough with no tobacco use history and no acute abnormalities on chest x-ray.  However the bibasilar atelectasis seen on the chest x-ray could actually represent areas of chronic or subacute infarction, and now  he is having some intermittent shortness of breath episodes with some chest pain.  His vital signs are stable and within normal limits, but given that he has a high pretest probability for pulmonary embolism I will proceed with CTA chest after obtaining basic lab work to verify renal function.  Patient agrees with plan.  Clinical Course as of Jul 29 332  Thu Jul 29, 2018  0041 Reassuring CBC, no leukocytosis, normal hemoglobin  CBC with Differential/Platelet(!) [CF]  0107 Normal BMP, proceeding with CTA  Basic metabolic panel(!) [CF]  3559 CTA chest negative for pulmonary embolism and any acute infection or fluid in his lungs.  I updated the patient and encouraged outpatient follow-up.  Prescribing Tessalon and encouraging the use of ibuprofen and Tylenol.  I gave my usual and customary return precautions.  CT  Angio Chest PE W/Cm &/Or Wo Cm [CF]    Clinical Course User Index [CF] Hinda Kehr, MD    ____________________________________________  FINAL CLINICAL IMPRESSION(S) / ED DIAGNOSES  Final diagnoses:  Cough  Chest wall pain     MEDICATIONS GIVEN DURING THIS VISIT:  Medications  iohexol (OMNIPAQUE) 350 MG/ML injection 75 mL (75 mLs Intravenous Contrast Given 07/29/18 0104)     ED Discharge Orders         Ordered    benzonatate (TESSALON PERLES) 100 MG capsule  3 times daily PRN     07/29/18 0152           Note:  This document was prepared using Dragon voice recognition software and may include unintentional dictation errors.    Hinda Kehr, MD 07/29/18 [phone]

## 2018-07-28 NOTE — ED Notes (Addendum)
Pt is AOx4, vss, he c/o right side chest pain when coughing, denies radiating left side chest pain, he is in bed with rails upx2, call bell is within reach. ED provider is at the bedside. We will continue to monitor.

## 2018-07-29 ENCOUNTER — Emergency Department: Payer: BLUE CROSS/BLUE SHIELD

## 2018-07-29 ENCOUNTER — Encounter: Payer: Self-pay | Admitting: Emergency Medicine

## 2018-07-29 LAB — BASIC METABOLIC PANEL
Anion gap: 8 (ref 5–15)
BUN: 19 mg/dL (ref 6–20)
CHLORIDE: 106 mmol/L (ref 98–111)
CO2: 24 mmol/L (ref 22–32)
CREATININE: 1.03 mg/dL (ref 0.61–1.24)
Calcium: 9 mg/dL (ref 8.9–10.3)
GFR calc Af Amer: >60 mL/min (ref >60)
GFR calc non Af Amer: >60 mL/min (ref >60)
Glucose, Bld: 106 mg/dL — ABNORMAL HIGH (ref 70–99)
POTASSIUM: 3.7 mmol/L (ref 3.5–5.1)
SODIUM: 138 mmol/L (ref 135–145)

## 2018-07-29 LAB — CBC WITH DIFFERENTIAL/PLATELET
ABS IMMATURE GRANULOCYTES: 0.01 10*3/uL (ref 0.00–0.07)
Basophils Absolute: 0 10*3/uL (ref 0.0–0.1)
Basophils Relative: 0 %
EOS PCT: 2 %
Eosinophils Absolute: 0.1 10*3/uL (ref 0.0–0.5)
HEMATOCRIT: 43.2 % (ref 39.0–52.0)
HEMOGLOBIN: 13.8 g/dL (ref 13.0–17.0)
Immature Granulocytes: 0 %
LYMPHS ABS: 1.7 10*3/uL (ref 0.7–4.0)
LYMPHS PCT: 35 %
MCH: 27.2 pg (ref 26.0–34.0)
MCHC: 31.9 g/dL (ref 30.0–36.0)
MCV: 85 fL (ref 80.0–100.0)
MONO ABS: 0.5 10*3/uL (ref 0.1–1.0)
MONOS PCT: 9 %
NEUTROS ABS: 2.7 10*3/uL (ref 1.7–7.7)
Neutrophils Relative %: 54 %
Platelets: 226 10*3/uL (ref 150–400)
RBC: 5.08 MIL/uL (ref 4.22–5.81)
RDW: 17.5 % — ABNORMAL HIGH (ref 11.5–15.5)
WBC: 5 10*3/uL (ref 4.0–10.5)
nRBC: 0 % (ref 0.0–0.2)

## 2018-07-29 MED ORDER — IOHEXOL 350 MG/ML SOLN
75.0000 mL | Freq: Once | INTRAVENOUS | Status: AC | PRN
  Administered 2018-07-29: 75 mL via INTRAVENOUS

## 2018-07-29 MED ORDER — BENZONATATE 100 MG PO CAPS: 100.0000 mg | ORAL_CAPSULE | Freq: Three times a day (TID) | ORAL | 0 refills | Status: DC | PRN

## 2018-07-29 NOTE — Discharge Instructions (Signed)
As we discussed, your work-up was reassuring today.  You have no sign of pneumonia and we even obtained a CT scan of your chest to rule out both subtle infection and a blood clot in your lungs (pulmonary embolism).  Your CT scan was reassuring as well and your lab work was normal.  We believe that the pain in your ribs is due to coughing hard enough that you have pulled some muscles.  Please take over-the-counter ibuprofen and Tylenol according to label instructions and follow-up with your doctor at the next available opportunity.

## 2018-07-29 NOTE — ED Notes (Signed)
Pt is being discharged to home. Pt is AOx4, vss, he denies chest pain at this time and does not show any signs of distress. AVS and RX was given and explained to the pt and he verbalized understanding of all information.

## 2018-07-29 NOTE — ED Notes (Signed)
Pt to CT with CT tech

## 2020-01-29 ENCOUNTER — Other Ambulatory Visit: Payer: Self-pay

## 2020-01-29 ENCOUNTER — Encounter: Payer: Self-pay | Admitting: Emergency Medicine

## 2020-01-29 ENCOUNTER — Emergency Department
Admission: EM | Admit: 2020-01-29 | Discharge: 2020-01-29 | Disposition: A | Discharge status: Home / Self Care | Payer: BC Managed Care – PPO | Attending: Emergency Medicine | Admitting: Emergency Medicine

## 2020-01-29 ENCOUNTER — Emergency Department: Payer: BC Managed Care – PPO

## 2020-01-29 DIAGNOSIS — R519 Headache, unspecified: Secondary | ICD-10-CM | POA: Insufficient documentation

## 2020-01-29 DIAGNOSIS — Y939 Activity, unspecified: Secondary | ICD-10-CM | POA: Insufficient documentation

## 2020-01-29 DIAGNOSIS — Y999 Unspecified external cause status: Secondary | ICD-10-CM | POA: Diagnosis not present

## 2020-01-29 DIAGNOSIS — I10 Essential (primary) hypertension: Secondary | ICD-10-CM | POA: Diagnosis not present

## 2020-01-29 DIAGNOSIS — Y929 Unspecified place or not applicable: Secondary | ICD-10-CM | POA: Insufficient documentation

## 2020-01-29 DIAGNOSIS — W19XXXA Unspecified fall, initial encounter: Secondary | ICD-10-CM

## 2020-01-29 DIAGNOSIS — W010XXA Fall on same level from slipping, tripping and stumbling without subsequent striking against object, initial encounter: Secondary | ICD-10-CM | POA: Diagnosis not present

## 2020-01-29 DIAGNOSIS — Z79899 Other long term (current) drug therapy: Secondary | ICD-10-CM | POA: Insufficient documentation

## 2020-01-29 MED ORDER — TRAMADOL HCL 50 MG PO TABS
50.0000 mg | ORAL_TABLET | Freq: Once | ORAL | Status: AC
  Administered 2020-01-29: 19:00:00 50 mg via ORAL
  Filled 2020-01-29: qty 1

## 2020-01-29 MED ORDER — ONDANSETRON 4 MG PO TBDP
4.0000 mg | ORAL_TABLET | Freq: Once | ORAL | Status: AC
  Administered 2020-01-29: 19:00:00 4 mg via ORAL
  Filled 2020-01-29: qty 1

## 2020-01-29 MED ORDER — ONDANSETRON 4 MG PO TBDP: 4.0000 mg | ORAL_TABLET | Freq: Three times a day (TID) | ORAL | 0 refills | Status: AC | PRN

## 2020-01-29 MED ORDER — TRAMADOL HCL 50 MG PO TABS: 50.0000 mg | ORAL_TABLET | Freq: Four times a day (QID) | ORAL | 0 refills | Status: AC | PRN

## 2020-01-29 NOTE — ED Notes (Signed)
Pt in room, pt calm and sitting in chair. Pt given ginger ale per EDP. Pt tolerating well.

## 2020-01-29 NOTE — ED Provider Notes (Signed)
Emergency Department Provider Note  ____________________________________________  Time seen: Approximately 10:33 PM  I have reviewed the triage vital signs and the nursing notes.   HISTORY  Chief Complaint Fall   Historian Patient     HPI Kevin Lowery is a 59 y.o. male presents to the emergency department after patient slipped and fell against the wall with shower, hitting his face and head.  Patient is unsure of loss of consciousness.  No numbness or tingling in the upper and lower extremities.  No chest pain, chest tightness or abdominal pain.  No lacerations.   Past Medical History:  Diagnosis Date  . Arthritis   . Gout   . Gout   . History of DVT of lower extremity    Anticoagulated for a couple of months  . Hyperlipidemia   . Hypertension      Immunizations up to date:  Yes.     Past Medical History:  Diagnosis Date  . Arthritis   . Gout   . Gout   . History of DVT of lower extremity    Anticoagulated for a couple of months  . Hyperlipidemia   . Hypertension     There are no problems to display for this patient.   Past Surgical History:  Procedure Laterality Date  . KNEE ARTHROSCOPY     right    Prior to Admission medications   Medication Sig Start Date End Date Taking? Authorizing Provider  allopurinol (ZYLOPRIM) 300 MG tablet Take 450 mg by mouth daily. 08/17/17   [provider]  atenolol (TENORMIN) 100 MG tablet Take 100 mg by mouth daily.  01/26/11   [provider]  AZOR 10-40 MG per tablet Take 1 tablet by mouth Daily. 01/26/11   [provider]  benzonatate (TESSALON PERLES) 100 MG capsule Take 1 capsule (100 mg total) by mouth 3 (three) times daily as needed for cough. 07/29/18   Hinda Kehr, MD  cyclobenzaprine (FLEXERIL) 10 MG tablet Take 1 tablet (10 mg total) by mouth at bedtime. 09/18/17   Lawyer, Harrell Gave, PA-C  meloxicam (MOBIC) 15 MG tablet Take 15 mg by mouth daily. 09/13/17   [provider]   methylPREDNISolone (MEDROL DOSEPAK) 4 MG tablet follow package directions Patient not taking: Reported on 09/18/2017 05/03/13   Wallene Huh, DPM  methylPREDNISolone (MEDROL DOSEPAK) 4 MG tablet follow package directions Patient not taking: Reported on 09/18/2017 05/03/13   Wallene Huh, DPM  methylPREDNISolone (MEDROL DOSEPAK) 4 MG TBPK tablet Take Tapered dose as directed 07/03/18   Sable Feil, PA-C  ondansetron (ZOFRAN ODT) 4 MG disintegrating tablet Take 1 tablet (4 mg total) by mouth every 8 (eight) hours as needed for up to 5 days. 01/29/20 02/03/20  Lannie Fields, PA-C  predniSONE (DELTASONE) 50 MG tablet Take 1 tablet (50 mg total) by mouth daily with breakfast. 09/18/17   Lawyer, Harrell Gave, PA-C  traMADol (ULTRAM) 50 MG tablet Take 1 tablet (50 mg total) by mouth every 6 (six) hours as needed for up to 3 days. 01/29/20 02/01/20  Lannie Fields, PA-C    Allergies Hydrocodone and Oxycodone  Family History  Problem Relation Age of Onset  . Prostate cancer Father   . Stroke Father   . Seizures Father   . Diabetes Mother   . Cancer Mother     Social History Social History   Tobacco Use  . Smoking status: Never Smoker  . Smokeless tobacco: Never Used  Substance Use Topics  . Alcohol  use: No  . Drug use: No     Review of Systems  Constitutional: No fever/chills Eyes:  No discharge ENT: No upper respiratory complaints. Respiratory: no cough. No SOB/ use of accessory muscles to breath Gastrointestinal:   No nausea, no vomiting.  No diarrhea.  No constipation. Musculoskeletal: Negative for musculoskeletal pain. Headache: Patient has headache.  Skin: Negative for rash, abrasions, lacerations, ecchymosis.    ____________________________________________   PHYSICAL EXAM:  VITAL SIGNS: ED Triage Vitals  Enc Vitals Group     BP 01/29/20 1549 135/90     Pulse Rate 01/29/20 1549 95     Resp 01/29/20 1549 16     Temp 01/29/20 1549 98 F (36.7 C)     Temp  Source 01/29/20 1549 Oral     SpO2 01/29/20 1549 98 %     Weight 01/29/20 1547 205 lb (93 kg)     Height 01/29/20 1547 5\' 9"  (1.753 m)     Head Circumference --      Peak Flow --      Pain Score 01/29/20 1546 3     Pain Loc --      Pain Edu? --      Excl. in [place]? --      Constitutional: Alert and oriented. Well appearing and in no acute distress. Eyes: Conjunctivae are normal. PERRL. EOMI. Head: Atraumatic. ENT:      Nose: No congestion/rhinnorhea.      Mouth/Throat: Mucous membranes are moist.  Neck: No stridor.  Full range of motion.  No midline C-spine tenderness to palpation. Cardiovascular: Normal rate, regular rhythm. Normal S1 and S2.  Good peripheral circulation. Respiratory: Normal respiratory effort without tachypnea or retractions. Lungs CTAB. Good air entry to the bases with no decreased or absent breath sounds Gastrointestinal: Bowel sounds x 4 quadrants. Soft and nontender to palpation. No guarding or rigidity. No distention. Musculoskeletal: Full range of motion to all extremities. No obvious deformities noted Neurologic:  Normal for age. No gross focal neurologic deficits are appreciated.  Skin:  Skin is warm, dry and intact. No rash noted. Psychiatric: Mood and affect are normal for age. Speech and behavior are normal.   ____________________________________________   LABS (all labs ordered are listed, but only abnormal results are displayed)  Labs Reviewed - No data to display ____________________________________________  EKG   ____________________________________________  RADIOLOGY Unk Pinto, personally viewed and evaluated these images (plain radiographs) as part of my medical decision making, as well as reviewing the written report by the radiologist.  CT Head Wo Contrast  Result Date: 01/29/2020 CLINICAL DATA:  Headache and neck pain after falling and hitting his face and head. The patient is taking Xarelto. EXAM: CT HEAD WITHOUT CONTRAST CT  MAXILLOFACIAL WITHOUT CONTRAST CT CERVICAL SPINE WITHOUT CONTRAST TECHNIQUE: Multidetector CT imaging of the head, cervical spine, and maxillofacial structures were performed using the standard protocol without intravenous contrast. Multiplanar CT image reconstructions of the cervical spine and maxillofacial structures were also generated. COMPARISON:  None. FINDINGS: CT HEAD FINDINGS Brain: Old left frontal lobe infarct. Several small old lacunar infarcts in the right frontal white matter. Normal size and position of the ventricles. No intracranial hemorrhage, mass lesion or CT evidence of acute infarction. Vascular: No hyperdense vessel or unexpected calcification. Skull: Normal. Negative for fracture or focal lesion. Other: None. CT MAXILLOFACIAL FINDINGS Osseous: No fracture or mandibular dislocation. No destructive process. Orbits: Negative. No traumatic or inflammatory finding. Sinuses: Clear. Soft tissues: Unremarkable. CT CERVICAL SPINE  FINDINGS Alignment: Mild reversal of the normal cervical lordosis. Mild retrolisthesis at the C5-6 level. Skull base and vertebrae: No acute fracture. No primary bone lesion or focal pathologic process. Soft tissues and spinal canal: No prevertebral fluid or swelling. No visible canal hematoma. Disc levels: Degenerative changes throughout the lower cervical and upper thoracic spine, including large anterior spurs on the right at the C6-7 level. Disc space narrowing at the C5-6 through T2-3 levels. Mild diffuse posterior disc protrusion and spur formation at the C5-6 level. Upper chest: Clear lung apices. Other: None. IMPRESSION: 1. No skull fracture or intracranial hemorrhage. 2. No maxillofacial fracture. 3. No cervical spine fracture or traumatic subluxation. 4. Old left frontal lobe infarct and several small old lacunar infarcts in the right frontal white matter. 5. Multilevel cervical spine degenerative changes. Electronically Signed   By: Claudie Revering M.D.   On:  01/29/2020 16:49   CT Cervical Spine Wo Contrast  Result Date: 01/29/2020 CLINICAL DATA:  Headache and neck pain after falling and hitting his face and head. The patient is taking Xarelto. EXAM: CT HEAD WITHOUT CONTRAST CT MAXILLOFACIAL WITHOUT CONTRAST CT CERVICAL SPINE WITHOUT CONTRAST TECHNIQUE: Multidetector CT imaging of the head, cervical spine, and maxillofacial structures were performed using the standard protocol without intravenous contrast. Multiplanar CT image reconstructions of the cervical spine and maxillofacial structures were also generated. COMPARISON:  None. FINDINGS: CT HEAD FINDINGS Brain: Old left frontal lobe infarct. Several small old lacunar infarcts in the right frontal white matter. Normal size and position of the ventricles. No intracranial hemorrhage, mass lesion or CT evidence of acute infarction. Vascular: No hyperdense vessel or unexpected calcification. Skull: Normal. Negative for fracture or focal lesion. Other: None. CT MAXILLOFACIAL FINDINGS Osseous: No fracture or mandibular dislocation. No destructive process. Orbits: Negative. No traumatic or inflammatory finding. Sinuses: Clear. Soft tissues: Unremarkable. CT CERVICAL SPINE FINDINGS Alignment: Mild reversal of the normal cervical lordosis. Mild retrolisthesis at the C5-6 level. Skull base and vertebrae: No acute fracture. No primary bone lesion or focal pathologic process. Soft tissues and spinal canal: No prevertebral fluid or swelling. No visible canal hematoma. Disc levels: Degenerative changes throughout the lower cervical and upper thoracic spine, including large anterior spurs on the right at the C6-7 level. Disc space narrowing at the C5-6 through T2-3 levels. Mild diffuse posterior disc protrusion and spur formation at the C5-6 level. Upper chest: Clear lung apices. Other: None. IMPRESSION: 1. No skull fracture or intracranial hemorrhage. 2. No maxillofacial fracture. 3. No cervical spine fracture or traumatic  subluxation. 4. Old left frontal lobe infarct and several small old lacunar infarcts in the right frontal white matter. 5. Multilevel cervical spine degenerative changes. Electronically Signed   By: Claudie Revering M.D.   On: 01/29/2020 16:49   CT Maxillofacial Wo Contrast  Result Date: 01/29/2020 CLINICAL DATA:  Headache and neck pain after falling and hitting his face and head. The patient is taking Xarelto. EXAM: CT HEAD WITHOUT CONTRAST CT MAXILLOFACIAL WITHOUT CONTRAST CT CERVICAL SPINE WITHOUT CONTRAST TECHNIQUE: Multidetector CT imaging of the head, cervical spine, and maxillofacial structures were performed using the standard protocol without intravenous contrast. Multiplanar CT image reconstructions of the cervical spine and maxillofacial structures were also generated. COMPARISON:  None. FINDINGS: CT HEAD FINDINGS Brain: Old left frontal lobe infarct. Several small old lacunar infarcts in the right frontal white matter. Normal size and position of the ventricles. No intracranial hemorrhage, mass lesion or CT evidence of acute infarction. Vascular: No hyperdense vessel or  unexpected calcification. Skull: Normal. Negative for fracture or focal lesion. Other: None. CT MAXILLOFACIAL FINDINGS Osseous: No fracture or mandibular dislocation. No destructive process. Orbits: Negative. No traumatic or inflammatory finding. Sinuses: Clear. Soft tissues: Unremarkable. CT CERVICAL SPINE FINDINGS Alignment: Mild reversal of the normal cervical lordosis. Mild retrolisthesis at the C5-6 level. Skull base and vertebrae: No acute fracture. No primary bone lesion or focal pathologic process. Soft tissues and spinal canal: No prevertebral fluid or swelling. No visible canal hematoma. Disc levels: Degenerative changes throughout the lower cervical and upper thoracic spine, including large anterior spurs on the right at the C6-7 level. Disc space narrowing at the C5-6 through T2-3 levels. Mild diffuse posterior disc protrusion  and spur formation at the C5-6 level. Upper chest: Clear lung apices. Other: None. IMPRESSION: 1. No skull fracture or intracranial hemorrhage. 2. No maxillofacial fracture. 3. No cervical spine fracture or traumatic subluxation. 4. Old left frontal lobe infarct and several small old lacunar infarcts in the right frontal white matter. 5. Multilevel cervical spine degenerative changes. Electronically Signed   By: Claudie Revering M.D.   On: 01/29/2020 16:49    ____________________________________________    PROCEDURES  Procedure(s) performed:     Procedures     Medications  traMADol (ULTRAM) tablet 50 mg (50 mg Oral Given 01/29/20 1841)  ondansetron (ZOFRAN-ODT) disintegrating tablet 4 mg (4 mg Oral Given 01/29/20 1841)     ____________________________________________   INITIAL IMPRESSION / ASSESSMENT AND PLAN / ED COURSE  Pertinent labs & imaging results that were available during my care of the patient were reviewed by me and considered in my medical decision making (see chart for details).      Assessment and plan Fall 58 year old male presents to the emergency department after having a mechanical, nonsyncopal fall.  Patient was hypertensive at triage but vital signs otherwise reassuring.  CT head revealed no evidence of intracranial bleed or skull fracture.  No C-spine fracture on CT of the cervical spine.  No signs of facial fracture on CT maxillofacial.  Patient was given tramadol in the emergency department for pain.  He was discharged with a short course of tramadol.  Return precautions were given to return with new or worsening symptoms.    ____________________________________________  FINAL CLINICAL IMPRESSION(S) / ED DIAGNOSES  Final diagnoses:  Fall, initial encounter      NEW MEDICATIONS STARTED DURING THIS VISIT:  ED Discharge Orders         Ordered    traMADol (ULTRAM) 50 MG tablet  Every 6 hours PRN     Discontinue  Reprint     01/29/20 1923     ondansetron (ZOFRAN ODT) 4 MG disintegrating tablet  Every 8 hours PRN     Discontinue  Reprint     01/29/20 1923              This chart was dictated using voice recognition software/Dragon. Despite best efforts to proofread, errors can occur which can change the meaning. Any change was purely unintentional.     Karren Cobble 01/29/20 2236    Carrie Mew, MD 01/30/20 [phone]

## 2020-01-29 NOTE — ED Triage Notes (Signed)
Pt bent over in shower to wash leg and slipped.  Hit face and head.  Mild headache and neck pain. Is on xarelto. Small knot to nose, forehead.  No LOC

## 2020-01-29 NOTE — Discharge Instructions (Signed)
Take Tramadol for pain.  

## 2020-02-24 ENCOUNTER — Emergency Department: Payer: BC Managed Care – PPO

## 2020-02-24 ENCOUNTER — Encounter: Payer: Self-pay | Admitting: Emergency Medicine

## 2020-02-24 ENCOUNTER — Emergency Department
Admission: EM | Admit: 2020-02-24 | Discharge: 2020-02-24 | Disposition: A | Discharge status: Home / Self Care | Payer: BC Managed Care – PPO | Attending: Student in an Organized Health Care Education/Training Program | Admitting: Student in an Organized Health Care Education/Training Program

## 2020-02-24 ENCOUNTER — Other Ambulatory Visit: Payer: Self-pay

## 2020-02-24 DIAGNOSIS — Z79899 Other long term (current) drug therapy: Secondary | ICD-10-CM | POA: Insufficient documentation

## 2020-02-24 DIAGNOSIS — R310 Gross hematuria: Secondary | ICD-10-CM | POA: Diagnosis not present

## 2020-02-24 DIAGNOSIS — R319 Hematuria, unspecified: Secondary | ICD-10-CM | POA: Diagnosis present

## 2020-02-24 DIAGNOSIS — I10 Essential (primary) hypertension: Secondary | ICD-10-CM | POA: Diagnosis not present

## 2020-02-24 LAB — CBC
HCT: 34.8 % — ABNORMAL LOW (ref 39.0–52.0)
Hemoglobin: 11.4 g/dL — ABNORMAL LOW (ref 13.0–17.0)
MCH: 28.3 pg (ref 26.0–34.0)
MCHC: 32.8 g/dL (ref 30.0–36.0)
MCV: 86.4 fL (ref 80.0–100.0)
Platelets: 260 10*3/uL (ref 150–400)
RBC: 4.03 MIL/uL — ABNORMAL LOW (ref 4.22–5.81)
RDW: 17.2 % — ABNORMAL HIGH (ref 11.5–15.5)
WBC: 2.9 10*3/uL — ABNORMAL LOW (ref 4.0–10.5)
nRBC: 0 % (ref 0.0–0.2)

## 2020-02-24 LAB — BASIC METABOLIC PANEL
Anion gap: 11 (ref 5–15)
BUN: 13 mg/dL (ref 6–20)
CO2: 26 mmol/L (ref 22–32)
Calcium: 9 mg/dL (ref 8.9–10.3)
Chloride: 105 mmol/L (ref 98–111)
Creatinine, Ser: 0.84 mg/dL (ref 0.61–1.24)
GFR calc Af Amer: >60 mL/min (ref >60)
GFR calc non Af Amer: >60 mL/min (ref >60)
Glucose, Bld: 105 mg/dL — ABNORMAL HIGH (ref 70–99)
Potassium: 3.3 mmol/L — ABNORMAL LOW (ref 3.5–5.1)
Sodium: 142 mmol/L (ref 135–145)

## 2020-02-24 LAB — URINALYSIS, COMPLETE (UACMP) WITH MICROSCOPIC
Bacteria, UA: NONE SEEN
Bilirubin Urine: NEGATIVE
Glucose, UA: NEGATIVE mg/dL
Ketones, ur: NEGATIVE mg/dL
Leukocytes,Ua: NEGATIVE
Nitrite: NEGATIVE
Protein, ur: 100 mg/dL — AB
Specific Gravity, Urine: 1.02 (ref 1.005–1.030)
pH: 5 (ref 5.0–8.0)

## 2020-02-24 NOTE — ED Notes (Signed)
Patient transported to CT.  Will continue to monitor.

## 2020-02-24 NOTE — ED Triage Notes (Signed)
Pt to ED via POV c/o blood in urine since last night. Pt states that when he first begins to urinate there is blood in his urine and then it clears up. Pt denies passing clots, denies pain when urinating. Pt is in NAD.

## 2020-02-24 NOTE — ED Provider Notes (Signed)
[facility] Emergency Department Provider Note    First MD Initiated Contact with Patient 02/24/20 [phone]     (approximate)  I have reviewed the triage vital signs and the nursing notes.   HISTORY  Chief Complaint Hematuria    HPI [name] is a 59 y.o. male with below listed past medical history presents to the ER for evaluation of hematuria x2.  Has had some mild left flank pain.  Does have a history of multiple myeloma is not on anticoagulation.  Denies any history of kidney stones.  No recent fevers.  No dysuria.  No clots are being passed.     Past Medical History:  Diagnosis Date  . Arthritis   . Gout   . Gout   . History of DVT of lower extremity    Anticoagulated for a couple of months  . Hyperlipidemia   . Hypertension    Family History  Problem Relation Age of Onset  . Prostate cancer Father   . Stroke Father   . Seizures Father   . Diabetes Mother   . Cancer Mother    Past Surgical History:  Procedure Laterality Date  . KNEE ARTHROSCOPY     right   There are no problems to display for this patient.     Prior to Admission medications   Medication Sig Start Date End Date Taking? Authorizing Provider  allopurinol (ZYLOPRIM) 300 MG tablet Take 450 mg by mouth daily. 08/17/17   [provider]  atenolol (TENORMIN) 100 MG tablet Take 100 mg by mouth daily.  01/26/11   [provider]  AZOR 10-40 MG per tablet Take 1 tablet by mouth Daily. 01/26/11   [provider]  benzonatate (TESSALON PERLES) 100 MG capsule Take 1 capsule (100 mg total) by mouth 3 (three) times daily as needed for cough. 07/29/18   Hinda Kehr, MD  cyclobenzaprine (FLEXERIL) 10 MG tablet Take 1 tablet (10 mg total) by mouth at bedtime. 09/18/17   Lawyer, Harrell Gave, PA-C  meloxicam (MOBIC) 15 MG tablet Take 15 mg by mouth daily. 09/13/17   [provider]  methylPREDNISolone (MEDROL DOSEPAK) 4 MG tablet follow package  directions Patient not taking: Reported on 09/18/2017 05/03/13   Wallene Huh, DPM  methylPREDNISolone (MEDROL DOSEPAK) 4 MG tablet follow package directions Patient not taking: Reported on 09/18/2017 05/03/13   Wallene Huh, DPM  methylPREDNISolone (MEDROL DOSEPAK) 4 MG TBPK tablet Take Tapered dose as directed 07/03/18   Sable Feil, PA-C  predniSONE (DELTASONE) 50 MG tablet Take 1 tablet (50 mg total) by mouth daily with breakfast. 09/18/17   Lawyer, Harrell Gave, PA-C    Allergies Hydrocodone and Oxycodone    Social History Social History   Tobacco Use  . Smoking status: Never Smoker  . Smokeless tobacco: Never Used  Substance Use Topics  . Alcohol use: No  . Drug use: No    Review of Systems Patient denies headaches, rhinorrhea, blurry vision, numbness, shortness of breath, chest pain, edema, cough, abdominal pain, nausea, vomiting, diarrhea, dysuria, fevers, rashes or hallucinations unless otherwise stated above in HPI. ____________________________________________   PHYSICAL EXAM:  VITAL SIGNS: Vitals:   02/24/20 0707 02/24/20 1002  BP: 138/89 (!) 150/95  Pulse: 60 (!) 55  Resp: 16 16  Temp: 98.5 F (36.9 C)   SpO2: 99% 99%    Constitutional: Alert and oriented.  Eyes: Conjunctivae are normal.  Head: Atraumatic. Nose: No congestion/rhinnorhea. Mouth/Throat: Mucous membranes are moist.  Neck: No stridor. Painless ROM.  Cardiovascular: Normal rate, regular rhythm. Grossly normal heart sounds.  Good peripheral circulation. Respiratory: Normal respiratory effort.  No retractions. Lungs CTAB. Gastrointestinal: Soft and nontender. No distention. No abdominal bruits. No CVA tenderness. Genitourinary:  Musculoskeletal: No lower extremity tenderness nor edema.  No joint effusions. Neurologic:  Normal speech and language. No gross focal neurologic deficits are appreciated. No facial droop Skin:  Skin is warm, dry and intact. No rash noted. Psychiatric: Mood and  affect are normal. Speech and behavior are normal.  ____________________________________________   LABS (all labs ordered are listed, but only abnormal results are displayed)  Results for orders placed or performed during the hospital encounter of 02/24/20 (from the past 24 hour(s))  Urinalysis, Complete w Microscopic Urine     Status: Abnormal   Collection Time: 02/24/20  7:12 AM  Result Value Ref Range   Color, Urine YELLOW (A) YELLOW   APPearance HAZY (A) CLEAR   Specific Gravity, Urine 1.020 1.005 - 1.030   pH 5.0 5.0 - 8.0   Glucose, UA NEGATIVE NEGATIVE mg/dL   Hgb urine dipstick MODERATE (A) NEGATIVE   Bilirubin Urine NEGATIVE NEGATIVE   Ketones, ur NEGATIVE NEGATIVE mg/dL   Protein, ur 100 (A) NEGATIVE mg/dL   Nitrite NEGATIVE NEGATIVE   Leukocytes,Ua NEGATIVE NEGATIVE   RBC / HPF 21-50 0 - 5 RBC/hpf   WBC, UA 0-5 0 - 5 WBC/hpf   Bacteria, UA NONE SEEN NONE SEEN   Squamous Epithelial / LPF 0-5 0 - 5   Mucus PRESENT   Basic metabolic panel     Status: Abnormal   Collection Time: 02/24/20  7:13 AM  Result Value Ref Range   Sodium 142 135 - 145 mmol/L   Potassium 3.3 (L) 3.5 - 5.1 mmol/L   Chloride 105 98 - 111 mmol/L   CO2 26 22 - 32 mmol/L   Glucose, Bld 105 (H) 70 - 99 mg/dL   BUN 13 6 - 20 mg/dL   Creatinine, Ser 0.84 0.61 - 1.24 mg/dL   Calcium 9.0 8.9 - 10.3 mg/dL   GFR calc non Af Amer >60 >60 mL/min   GFR calc Af Amer >60 >60 mL/min   Anion gap 11 5 - 15  CBC     Status: Abnormal   Collection Time: 02/24/20  7:13 AM  Result Value Ref Range   WBC 2.9 (L) 4.0 - 10.5 K/uL   RBC 4.03 (L) 4.22 - 5.81 MIL/uL   Hemoglobin 11.4 (L) 13.0 - 17.0 g/dL   HCT 34.8 (L) 39 - 52 %   MCV 86.4 80.0 - 100.0 fL   MCH 28.3 26.0 - 34.0 pg   MCHC 32.8 30.0 - 36.0 g/dL   RDW 17.2 (H) 11.5 - 15.5 %   Platelets 260 150 - 400 K/uL   nRBC 0.0 0.0 - 0.2 %   ____________________________________________  EKG____________________________________________  RADIOLOGY  I  personally reviewed all radiographic images ordered to evaluate for the above acute complaints and reviewed radiology reports and findings.  These findings were personally discussed with the patient.  Please see medical record for radiology report.  ____________________________________________   PROCEDURES  Procedure(s) performed:  Procedures    Critical Care performed: no ____________________________________________   INITIAL IMPRESSION / ASSESSMENT AND PLAN / ED COURSE  Pertinent labs & imaging results that were available during my care of the patient were reviewed by me and considered in my medical decision making (see chart for details).   DDX:  stone, nephritis, aki, mass, prostatitis, cystitis  Bricen Victory is a 59 y.o. who presents to the ED with hematuria with some left flank pain as described above is currently well-appearing. Seems that the hematuria does clear after initiating stream. Not have any signs of retention. No fevers. Urinalysis does not show any sign of WBCs or bacteria nitrite negative leuk esterase negative. Renal function is normal. Will order CT renal to evaluate for any evidence obstructive uropathy or stone.  Clinical Course as of Feb 23 1021  Fri Feb 24, 2020  1018 Patient reassessed. He just went to use restroom and states that the urine was clear. At this point I will see any indication of infection his scan does not show any evidence of stone or obstructive uropathy. He has no pain.  I do believe he is appropriate for outpatient work-up. Will give referral to urology. We discussed signs and symptoms for which he should return to the ER.   [PR]    Clinical Course User Index [PR] Merlyn Lot, MD    The patient was evaluated in Emergency Department today for the symptoms described in the history of present illness. He/she was evaluated in the context of the global COVID-19 pandemic, which necessitated consideration that the patient might be at risk  for infection with the SARS-CoV-2 virus that causes COVID-19. Institutional protocols and algorithms that pertain to the evaluation of patients at risk for COVID-19 are in a state of rapid change based on information released by regulatory bodies including the CDC and federal and state organizations. These policies and algorithms were followed during the patient's care in the ED.  As part of my medical decision making, I reviewed the following data within the [facility] notes reviewed and incorporated, Labs reviewed, notes from prior ED visits and [place] Controlled Substance Database   ____________________________________________   FINAL CLINICAL IMPRESSION(S) / ED DIAGNOSES  Final diagnoses:  Gross hematuria      NEW MEDICATIONS STARTED DURING THIS VISIT:  New Prescriptions   No medications on file     Note:  This document was prepared using Dragon voice recognition software and may include unintentional dictation errors.    Merlyn Lot, MD 02/24/20 1022

## 2021-03-23 ENCOUNTER — Emergency Department: Payer: BC Managed Care – PPO

## 2021-03-23 ENCOUNTER — Emergency Department
Admission: EM | Admit: 2021-03-23 | Discharge: 2021-03-23 | Disposition: A | Discharge status: Home / Self Care | Payer: BC Managed Care – PPO | Attending: Emergency Medicine | Admitting: Emergency Medicine

## 2021-03-23 ENCOUNTER — Other Ambulatory Visit: Payer: Self-pay

## 2021-03-23 DIAGNOSIS — I10 Essential (primary) hypertension: Secondary | ICD-10-CM | POA: Insufficient documentation

## 2021-03-23 DIAGNOSIS — S301XXA Contusion of abdominal wall, initial encounter: Secondary | ICD-10-CM | POA: Diagnosis not present

## 2021-03-23 DIAGNOSIS — X58XXXA Exposure to other specified factors, initial encounter: Secondary | ICD-10-CM | POA: Diagnosis not present

## 2021-03-23 DIAGNOSIS — J40 Bronchitis, not specified as acute or chronic: Secondary | ICD-10-CM | POA: Insufficient documentation

## 2021-03-23 DIAGNOSIS — R1010 Upper abdominal pain, unspecified: Secondary | ICD-10-CM

## 2021-03-23 DIAGNOSIS — Z20822 Contact with and (suspected) exposure to covid-19: Secondary | ICD-10-CM | POA: Diagnosis not present

## 2021-03-23 DIAGNOSIS — Z79899 Other long term (current) drug therapy: Secondary | ICD-10-CM | POA: Insufficient documentation

## 2021-03-23 DIAGNOSIS — S3991XA Unspecified injury of abdomen, initial encounter: Secondary | ICD-10-CM | POA: Diagnosis present

## 2021-03-23 DIAGNOSIS — J0100 Acute maxillary sinusitis, unspecified: Secondary | ICD-10-CM | POA: Insufficient documentation

## 2021-03-23 LAB — DIFFERENTIAL
Abs Immature Granulocytes: 0.03 10*3/uL (ref 0.00–0.07)
Basophils Absolute: 0 10*3/uL (ref 0.0–0.1)
Basophils Relative: 0 %
Eosinophils Absolute: 0 10*3/uL (ref 0.0–0.5)
Eosinophils Relative: 0 %
Immature Granulocytes: 1 %
Lymphocytes Relative: 19 %
Lymphs Abs: 1.2 10*3/uL (ref 0.7–4.0)
Monocytes Absolute: 0.6 10*3/uL (ref 0.1–1.0)
Monocytes Relative: 10 %
Neutro Abs: 4.2 10*3/uL (ref 1.7–7.7)
Neutrophils Relative %: 70 %

## 2021-03-23 LAB — COMPREHENSIVE METABOLIC PANEL
ALT: 19 U/L (ref 0–44)
AST: 20 U/L (ref 15–41)
Albumin: 3.9 g/dL (ref 3.5–5.0)
Alkaline Phosphatase: 103 U/L (ref 38–126)
Anion gap: 7 (ref 5–15)
BUN: 17 mg/dL (ref 6–20)
CO2: 27 mmol/L (ref 22–32)
Calcium: 9.5 mg/dL (ref 8.9–10.3)
Chloride: 105 mmol/L (ref 98–111)
Creatinine, Ser: 1.35 mg/dL — ABNORMAL HIGH (ref 0.61–1.24)
GFR, Estimated: >60 mL/min (ref >60)
Glucose, Bld: 114 mg/dL — ABNORMAL HIGH (ref 70–99)
Potassium: 4.1 mmol/L (ref 3.5–5.1)
Sodium: 139 mmol/L (ref 135–145)
Total Bilirubin: 0.9 mg/dL (ref 0.3–1.2)
Total Protein: 7.5 g/dL (ref 6.5–8.1)

## 2021-03-23 LAB — CBC
HCT: 39.8 % (ref 39.0–52.0)
Hemoglobin: 13 g/dL (ref 13.0–17.0)
MCH: 26.7 pg (ref 26.0–34.0)
MCHC: 32.7 g/dL (ref 30.0–36.0)
MCV: 81.9 fL (ref 80.0–100.0)
Platelets: 303 10*3/uL (ref 150–400)
RBC: 4.86 MIL/uL (ref 4.22–5.81)
RDW: 19.6 % — ABNORMAL HIGH (ref 11.5–15.5)
WBC: 6.1 10*3/uL (ref 4.0–10.5)
nRBC: 0.3 % — ABNORMAL HIGH (ref 0.0–0.2)

## 2021-03-23 LAB — LIPASE, BLOOD: Lipase: 27 U/L (ref 11–51)

## 2021-03-23 LAB — PROTIME-INR
INR: 1.5 — ABNORMAL HIGH (ref 0.8–1.2)
Prothrombin Time: 18.3 seconds — ABNORMAL HIGH (ref 11.4–15.2)

## 2021-03-23 LAB — LACTIC ACID, PLASMA: Lactic Acid, Venous: 1.1 mmol/L (ref 0.5–1.9)

## 2021-03-23 LAB — RESP PANEL BY RT-PCR (FLU A&B, COVID) ARPGX2
Influenza A by PCR: NEGATIVE
Influenza B by PCR: NEGATIVE
SARS Coronavirus 2 by RT PCR: NEGATIVE

## 2021-03-23 LAB — APTT: aPTT: 59 seconds — ABNORMAL HIGH (ref 24–36)

## 2021-03-23 MED ORDER — ONDANSETRON HCL 4 MG/2ML IJ SOLN
4.0000 mg | Freq: Once | INTRAMUSCULAR | Status: AC
  Administered 2021-03-23: 4 mg via INTRAVENOUS
  Filled 2021-03-23: qty 2

## 2021-03-23 MED ORDER — AZITHROMYCIN 250 MG PO TABS
250.0000 mg | ORAL_TABLET | Freq: Every day | ORAL | Status: DC
  Administered 2021-03-23: 250 mg via ORAL
  Filled 2021-03-23: qty 1

## 2021-03-23 MED ORDER — MORPHINE SULFATE (PF) 4 MG/ML IV SOLN
4.0000 mg | Freq: Once | INTRAVENOUS | Status: AC
Start: 2021-03-23 — End: 2021-03-23
  Administered 2021-03-23: 4 mg via INTRAVENOUS
  Filled 2021-03-23: qty 1

## 2021-03-23 MED ORDER — IOHEXOL 9 MG/ML PO SOLN
1000.0000 mL | Freq: Once | ORAL | Status: AC | PRN
  Administered 2021-03-23: 1000 mL via ORAL

## 2021-03-23 MED ORDER — AZITHROMYCIN 500 MG PO TABS
500.0000 mg | ORAL_TABLET | Freq: Once | ORAL | Status: AC
  Administered 2021-03-23: 500 mg via ORAL
  Filled 2021-03-23: qty 1

## 2021-03-23 MED ORDER — IOHEXOL 350 MG/ML SOLN
75.0000 mL | Freq: Once | INTRAVENOUS | Status: AC | PRN
  Administered 2021-03-23: 75 mL via INTRAVENOUS

## 2021-03-23 NOTE — ED Notes (Signed)
Ed md and surgeon requested pt stay in hospital , pt decided to leave AMA and go to [facility] . Ed md aware

## 2021-03-23 NOTE — H&P (Addendum)
Subjective:   CC: abdominal wall hematoma  HPI:  Kevin Lowery is a 60 y.o. male who was consulted by [facility] for evaluation of above.  First noted 1 week ago.  Symptoms include: Pain is dull, worsening especially the past 3 days or so.  Exacerbated by touch.  Alleviated by nothing specific.  Associated with cough.  Recent coughing fit prior to pain episode  On xarelto for unknown reason.   Past Medical History:  has a past medical history of Arthritis, Gout, Gout, History of DVT of lower extremity, Hyperlipidemia, and Hypertension.  Past Surgical History:  has a past surgical history that includes Knee arthroscopy.  Family History: family history includes Cancer in his mother; Diabetes in his mother; Prostate cancer in his father; Seizures in his father; Stroke in his father.  Social History:  reports that he has never smoked. He has never used smokeless tobacco. He reports that he does not drink alcohol and does not use drugs.  Current Medications:  Prior to Admission medications   Medication Sig Start Date End Date Taking? Authorizing Provider  allopurinol (ZYLOPRIM) 300 MG tablet Take 450 mg by mouth daily. 08/17/17  Yes [provider]  carvedilol (COREG) 25 MG tablet Take 1.5 tablets by mouth in the morning and at bedtime. 12/28/20  Yes [provider]  cyanocobalamin 1000 MCG tablet Take 1,000 mcg by mouth daily.   Yes [provider]  DULoxetine (CYMBALTA) 60 MG capsule Take 60 mg by mouth daily. 01/28/21  Yes [provider]  ENTRESTO 49-51 MG Take 1 tablet by mouth 2 (two) times daily. 01/21/21  Yes [provider]  furosemide (LASIX) 20 MG tablet Take 20 mg by mouth daily. 01/21/21  Yes [provider]  JARDIANCE 10 MG TABS tablet Take 10 mg by mouth daily. 02/22/21  Yes [provider]  pantoprazole (PROTONIX) 40 MG tablet Take 40 mg by mouth daily. 01/31/21  Yes [provider]  potassium chloride SA (KLOR-CON)  20 MEQ tablet Take 20 mEq by mouth 2 (two) times daily. 12/30/20  Yes [provider]  pregabalin (LYRICA) 150 MG capsule Take 150 mg by mouth 3 (three) times daily. 03/11/21  Yes [provider]  spironolactone (ALDACTONE) 25 MG tablet Take 25 mg by mouth daily. 01/18/21  Yes [provider]  XARELTO 20 MG TABS tablet Take 20 mg by mouth daily as needed. 01/23/21  Yes [provider]  POMALYST 3 MG capsule Take by mouth. Patient not taking: No sig reported 02/19/21   [provider]    Allergies:  Allergies  Allergen Reactions   Hydrocodone Nausea And Vomiting   Oxycodone Nausea And Vomiting    ROS:  General: Denies weight loss, weight gain, fatigue, fevers, chills, and night sweats. Eyes: Denies blurry vision, double vision, eye pain, itchy eyes, and tearing. Ears: Denies hearing loss, earache, and ringing in ears. Nose: Denies sinus pain, congestion, infections, runny nose, and nosebleeds. Mouth/throat: Denies hoarseness, sore throat, bleeding gums, and difficulty swallowing. Heart: Denies chest pain, palpitations, racing heart, irregular heartbeat, leg pain or swelling, and decreased activity tolerance. Respiratory: Denies breathing difficulty, shortness of breath, wheezing, cough, and sputum. GI: Denies change in appetite, heartburn, nausea, vomiting, constipation, diarrhea, and blood in stool. GU: Denies difficulty urinating, pain with urinating, urgency, frequency, blood in urine. Musculoskeletal: Denies joint stiffness, pain, swelling, muscle weakness. Skin: Denies rash, itching, mass, tumors, sores, and boils Neurologic: Denies headache, fainting, dizziness, seizures, numbness, and tingling. Psychiatric: Denies depression,  anxiety, difficulty sleeping, and memory loss. Endocrine: Denies heat or cold intolerance, and increased thirst or urination. Blood/lymph: Denies easy bruising, easy bruising, and swollen glands     Objective:      BP (!) 144/70 (BP Location: Right Arm)   Pulse 100   Temp 99.1 F (37.3 C) (Oral)   Resp 17   Ht '5\' 9"'  (1.753 m)   Wt 105.2 kg   SpO2 98%   BMI 34.26 kg/m   Constitutional :  alert, cooperative, appears stated age, and no distress  Lymphatics/Throat:  no asymmetry, masses, or scars  Respiratory:  clear to auscultation bilaterally  Cardiovascular:  regular rate and rhythm  Gastrointestinal: Soft, no guarding, palpable firmness within left rectus sheath, in area of concern noted on CT.  No obvious overlying skin changes, moderate TTP .  Musculoskeletal: Steady gait and movement  Skin: Cool and moist,  surgical scars from prior open chole  Psychiatric: Normal affect, non-agitated, not confused       LABS:  CMP Latest Ref Rng & Units 03/23/2021 02/24/2020 07/29/2018  Glucose 70 - 99 mg/dL 114(H) 105(H) 106(H)  BUN 6 - 20 mg/dL '17 13 19  ' Creatinine 0.61 - 1.24 mg/dL 1.35(H) 0.84 1.03  Sodium 135 - 145 mmol/L 139 142 138  Potassium 3.5 - 5.1 mmol/L 4.1 3.3(L) 3.7  Chloride 98 - 111 mmol/L 105 105 106  CO2 22 - 32 mmol/L '27 26 24  ' Calcium 8.9 - 10.3 mg/dL 9.5 9.0 9.0  Total Protein 6.5 - 8.1 g/dL 7.5 - -  Total Bilirubin 0.3 - 1.2 mg/dL 0.9 - -  Alkaline Phos 38 - 126 U/L 103 - -  AST 15 - 41 U/L 20 - -  ALT 0 - 44 U/L 19 - -   CBC Latest Ref Rng & Units 03/23/2021 02/24/2020 07/29/2018  WBC 4.0 - 10.5 K/uL 6.1 2.9(L) 5.0  Hemoglobin 13.0 - 17.0 g/dL 13.0 11.4(L) 13.8  Hematocrit 39.0 - 52.0 % 39.8 34.8(L) 43.2  Platelets 150 - 400 K/uL 303 260 226    RADS: CLINICAL DATA:  Left lower quadrant abdominal pain   EXAM: CT ABDOMEN AND PELVIS WITH CONTRAST   TECHNIQUE: Multidetector CT imaging of the abdomen and pelvis was performed using the standard protocol following bolus administration of intravenous contrast.   CONTRAST:  23m OMNIPAQUE IOHEXOL 350 MG/ML SOLN   COMPARISON:  02/24/2020   FINDINGS: Lower chest: Lung bases are clear.   Hepatobiliary: Heterogeneous  perfusion of the liver. This appearance suggests underlying geographic hepatic steatosis.   Interval cholecystectomy with a 3.0 x 2.0 cm postoperative seroma in the surgical bed (series 6/image 23). No intrahepatic or extrahepatic ductal dilatation.   Pancreas: Within normal limits.   Spleen: Within normal limits.   Adrenals/Urinary Tract: Adrenal glands are within normal limits.   Kidneys are within normal limits.  No hydronephrosis.   Bladder is within normal limits.   Stomach/Bowel: Stomach is within normal limits.   No evidence of bowel obstruction.   Normal appendix (series 6/image 62).   Mild left colonic diverticulosis, without evidence of diverticulitis.   Vascular/Lymphatic: No evidence of abdominal aortic aneurysm.   No suspicious abdominopelvic lymphadenopathy.   Reproductive: Prostate is unremarkable.   Other: Trace pelvic ascites (series 6/image 76).   Musculoskeletal: Postsurgical changes along the anterior abdominal wall.   Enlargement of the left rectus sheath (series 6/image 50), measuring 3 cm in thickness, suggesting a small rectus sheath hematoma.   Degenerative changes of the visualized  thoracolumbar spine. Expansile lytic lesion in the left inferior pubic ramus (series 6/image 91). Lytic lesion in the posterior L2 vertebral body (sagittal image 79). Suspected lytic lesion in the T11 vertebral body (sagittal image 77). These findings are similar to the prior, and likely related to the patient's known multiple myeloma.   IMPRESSION: Small left rectus sheath hematoma, measuring 3 cm in thickness, likely accounting for the patient's abdominal pain.   Stable lytic lesions, likely related to the patient's known multiple myeloma, as above.   Interval cholecystectomy with postoperative seroma in the surgical bed.     Electronically Signed   By: Julian Hy M.D.   On: 03/23/2021 19:31  Assessment:      Rectus sheath hematoma, likely  from coughing fit while on xarelto.  Plan:     Clinically stable for now with hgb wnl, no obvious extravasation noted on CT but unsure of timing.  Recommended monitoring inhouse with serial abdominal exams and repeat labs.  Pt and wife specifically stated they will prefer to leave AMA and go over to [place] ED to be evaluated for this hematoma since he is undergoing treatment for his multiple myeloma over there.  I explaiend to him that his multiple myeloma is not likely contributing to his current issues and we are capable of monitoring him here, but patient insists wife still will like to have him moved to [facility].  Pt stated he will be leaving AMA in order to go to [facility].  Will transfer his CT images, in hopes that he will not need to repeat any additional studies. He understands all the risks involved with leaving AMA, including continued declined in health, possible death while enroute to [place].   All questions addressed.

## 2021-03-23 NOTE — ED Provider Notes (Addendum)
[facility] Emergency Department Provider Note   ____________________________________________   Event Date/Time   First MD Initiated Contact with Patient 03/23/21 1731     (approximate)  I have reviewed the triage vital signs and the nursing notes.   HISTORY  Chief Complaint Abdominal Pain   HPI Kevin Lowery is a 60 y.o. male who reports he has been coughing up yellow phlegm for about 2 weeks is also having postnasal drip that started several days after the cough began.  He is not short of breath.  He has been running a temperature of 99.  Reports after about a week of coughing he developed left lower quadrant pain.  This is gotten much worse in the last 3 days.  It is worse with coughing or deep breathing or movement.  Sometimes it can be sharp and usually just achy.  It is moderately severe.  He is not having any nausea vomiting or diarrhea with it.  He is not short of breath.  He does have multiple myeloma which is in relapse.         Past Medical History:  Diagnosis Date   Arthritis    Gout    Gout    History of DVT of lower extremity    Anticoagulated for a couple of months   Hyperlipidemia    Hypertension     There are no problems to display for this patient.   Past Surgical History:  Procedure Laterality Date   KNEE ARTHROSCOPY     right    Prior to Admission medications   Medication Sig Start Date End Date Taking? Authorizing Provider  allopurinol (ZYLOPRIM) 300 MG tablet Take 450 mg by mouth daily. 08/17/17   [provider]  atenolol (TENORMIN) 100 MG tablet Take 100 mg by mouth daily.  01/26/11   [provider]  AZOR 10-40 MG per tablet Take 1 tablet by mouth Daily. 01/26/11   [provider]  benzonatate (TESSALON PERLES) 100 MG capsule Take 1 capsule (100 mg total) by mouth 3 (three) times daily as needed for cough. 07/29/18   Hinda Kehr, MD  cyclobenzaprine (FLEXERIL) 10 MG tablet Take 1 tablet (10  mg total) by mouth at bedtime. 09/18/17   Lawyer, Harrell Gave, PA-C  meloxicam (MOBIC) 15 MG tablet Take 15 mg by mouth daily. 09/13/17   [provider]  methylPREDNISolone (MEDROL DOSEPAK) 4 MG tablet follow package directions Patient not taking: Reported on 09/18/2017 05/03/13   Wallene Huh, DPM  methylPREDNISolone (MEDROL DOSEPAK) 4 MG tablet follow package directions Patient not taking: Reported on 09/18/2017 05/03/13   Wallene Huh, DPM  methylPREDNISolone (MEDROL DOSEPAK) 4 MG TBPK tablet Take Tapered dose as directed 07/03/18   Sable Feil, PA-C  predniSONE (DELTASONE) 50 MG tablet Take 1 tablet (50 mg total) by mouth daily with breakfast. 09/18/17   Lawyer, Harrell Gave, PA-C    Allergies Hydrocodone and Oxycodone  Family History  Problem Relation Age of Onset   Prostate cancer Father    Stroke Father    Seizures Father    Diabetes Mother    Cancer Mother     Social History Social History   Tobacco Use   Smoking status: Never   Smokeless tobacco: Never  Substance Use Topics   Alcohol use: No   Drug use: No    Review of Systems  Constitutional: Fever of 99 no chills Eyes: No visual changes. ENT: No sore throat. Cardiovascular: Denies chest pain. Respiratory: Denies  shortness of breath. Gastrointestinal:  abdominal pain.  No nausea, no vomiting.  No diarrhea.  No constipation. Genitourinary: Negative for dysuria. Musculoskeletal: Negative for back pain. Skin: Negative for rash. Neurological: Negative for headaches, focal weakness   ____________________________________________   PHYSICAL EXAM:  VITAL SIGNS: ED Triage Vitals  Enc Vitals Group     BP 03/23/21 1722 (!) 151/106     Pulse Rate 03/23/21 1722 (!) 104     Resp 03/23/21 1722 18     Temp 03/23/21 1722 99.1 F (37.3 C)     Temp Source 03/23/21 1722 Oral     SpO2 03/23/21 1722 98 %     Weight 03/23/21 1723 232 lb (105.2 kg)     Height 03/23/21 1723 '5\' 9"'  (1.753 m)     Head  Circumference --      Peak Flow --      Pain Score 03/23/21 1723 9     Pain Loc --      Pain Edu? --      Excl. in [place]? --    Constitutional: Alert and oriented. Well appearing and in no acute distress. Eyes: Conjunctivae are normal. PER Head: Atraumatic. Nose: No congestion/rhinnorhea. Mouth/Throat: Mucous membranes are moist.  Oropharynx non-erythematous. Neck: No stridor.   Cardiovascular: Normal rate, regular rhythm. Grossly normal heart sounds.  Good peripheral circulation. Respiratory: Normal respiratory effort.  No retractions. Lungs CTAB. Gastrointestinal: Soft and nontender except just to the left and below the umbilicus where there is a spot about the size of a quarter or so that is tender to palpation percussion.  There is rebound tenderness as well.. No distention. No abdominal bruits.  Musculoskeletal: No lower extremity tenderness nor edema.   Neurologic:  Normal speech and language. No gross focal neurologic deficits are appreciated.  Skin:  Skin is warm, dry and intact. No rash noted.   ____________________________________________   LABS (all labs ordered are listed, but only abnormal results are displayed)  Labs Reviewed  COMPREHENSIVE METABOLIC PANEL - Abnormal; Notable for the following components:      Result Value   Glucose, Bld 114 (*)    Creatinine, Ser 1.35 (*)    All other components within normal limits  CBC - Abnormal; Notable for the following components:   RDW 19.6 (*)    nRBC 0.3 (*)    All other components within normal limits  APTT - Abnormal; Notable for the following components:   aPTT 59 (*)    All other components within normal limits  PROTIME-INR - Abnormal; Notable for the following components:   Prothrombin Time 18.3 (*)    INR 1.5 (*)    All other components within normal limits  RESP PANEL BY RT-PCR (FLU A&B, COVID) ARPGX2  LIPASE, BLOOD  DIFFERENTIAL  LACTIC ACID, PLASMA  URINALYSIS, COMPLETE (UACMP) WITH MICROSCOPIC  LACTIC  ACID, PLASMA   ____________________________________________  EKG   ____________________________________________  RADIOLOGY Gertha Calkin, personally viewed and evaluated these images (plain radiographs) as part of my medical decision making, as well as reviewing the written report by the radiologist.  ED MD interpretation:    Official radiology report(s): DG Chest 2 View  Result Date: 03/23/2021 CLINICAL DATA:  Productive cough, chills, fever for 2 weeks EXAM: CHEST - 2 VIEW COMPARISON:  07/28/2018 FINDINGS: Frontal and lateral views of the chest demonstrate an unremarkable cardiac silhouette. Linear densities within the lung bases likely reflects scarring or subsegmental atelectasis. No airspace disease, effusion, or pneumothorax. No acute  bony abnormalities. IMPRESSION: 1. No acute intrathoracic process. Electronically Signed   By: Randa Ngo M.D.   On: 03/23/2021 18:10   CT ABDOMEN PELVIS W CONTRAST  Result Date: 03/23/2021 CLINICAL DATA:  Left lower quadrant abdominal pain EXAM: CT ABDOMEN AND PELVIS WITH CONTRAST TECHNIQUE: Multidetector CT imaging of the abdomen and pelvis was performed using the standard protocol following bolus administration of intravenous contrast. CONTRAST:  44m OMNIPAQUE IOHEXOL 350 MG/ML SOLN COMPARISON:  02/24/2020 FINDINGS: Lower chest: Lung bases are clear. Hepatobiliary: Heterogeneous perfusion of the liver. This appearance suggests underlying geographic hepatic steatosis. Interval cholecystectomy with a 3.0 x 2.0 cm postoperative seroma in the surgical bed (series 6/image 23). No intrahepatic or extrahepatic ductal dilatation. Pancreas: Within normal limits. Spleen: Within normal limits. Adrenals/Urinary Tract: Adrenal glands are within normal limits. Kidneys are within normal limits.  No hydronephrosis. Bladder is within normal limits. Stomach/Bowel: Stomach is within normal limits. No evidence of bowel obstruction. Normal appendix (series 6/image  62). Mild left colonic diverticulosis, without evidence of diverticulitis. Vascular/Lymphatic: No evidence of abdominal aortic aneurysm. No suspicious abdominopelvic lymphadenopathy. Reproductive: Prostate is unremarkable. Other: Trace pelvic ascites (series 6/image 76). Musculoskeletal: Postsurgical changes along the anterior abdominal wall. Enlargement of the left rectus sheath (series 6/image 50), measuring 3 cm in thickness, suggesting a small rectus sheath hematoma. Degenerative changes of the visualized thoracolumbar spine. Expansile lytic lesion in the left inferior pubic ramus (series 6/image 91). Lytic lesion in the posterior L2 vertebral body (sagittal image 79). Suspected lytic lesion in the T11 vertebral body (sagittal image 77). These findings are similar to the prior, and likely related to the patient's known multiple myeloma. IMPRESSION: Small left rectus sheath hematoma, measuring 3 cm in thickness, likely accounting for the patient's abdominal pain. Stable lytic lesions, likely related to the patient's known multiple myeloma, as above. Interval cholecystectomy with postoperative seroma in the surgical bed. Electronically Signed   By: SJulian HyM.D.   On: 03/23/2021 19:31    ____________________________________________   PROCEDURES  Procedure(s) performed (including Critical Care):  Procedures   ____________________________________________   INITIAL IMPRESSION / ASSESSMENT AND PLAN / ED COURSE  ----------------------------------------- 8:27 PM on 03/23/2021 ----------------------------------------- Patient is taking Xarelto given to him for his chemo that he is getting.  There is pain in his belly came on after he been coughing for a while.  Likely he tore something with the coughing.  I am paging surgery to see about following him or see if there is anything else we should do with him.   ----------------------------------------- 8:54 PM on  03/23/2021 -----------------------------------------  Discussed with Dr. SLysle Pearl surgery.  He would like to observe the patient overnight since he is on Xarelto.  Additionally I will give him some Zithromax for bronchitis/sinusitis.         ____________________________________________   FINAL CLINICAL IMPRESSION(S) / ED DIAGNOSES  Final diagnoses:  Pain of upper abdomen  Rectus sheath hematoma, initial encounter  Bronchitis  Acute maxillary sinusitis, recurrence not specified     ED Discharge Orders     None        Note:  This document was prepared using Dragon voice recognition software and may include unintentional dictation errors.    MNena Polio MD 03/23/21 2057 I talked to the patient about admission he was willing and then changed his mind I went back and talk to him again and he again became willing the surgeon talked to him and he was willing to be admitted and then  he spoke apparently with his wife and his wife that he needs to go to [facility] where he gets his multiple myeloma treatment and so now he is going to leave here irregardless of what we want to do.  He understands that this is not a safe thing and that he will be leaving against advice.  I will attempt to contact [facility] to see if we can make anything easier for him and we will try to power share the CT with [facility] so that they do not have to repeat CAT scan him. ----------------------------------------- 9:48 PM on 03/23/2021 ----------------------------------------- Patient has left, [facility] put [place] on hold.  I could not convince the patient to stay longer.   Nena Polio, MD 03/23/21 2148

## 2021-03-23 NOTE — ED Triage Notes (Signed)
Pt to ED for LLQ pain for the past 2 days, states has been coughing for past few weeks and it is making it worse.  Denies n/v/d

## 2021-07-02 ENCOUNTER — Ambulatory Visit: Payer: BC Managed Care – PPO | Admitting: Physical Therapy

## 2021-12-23 ENCOUNTER — Observation Stay
Admission: EM | Admit: 2021-12-23 | Discharge: 2021-12-24 | Disposition: A | Discharge status: Home / Self Care | Payer: BC Managed Care – PPO | Attending: Internal Medicine | Admitting: Internal Medicine

## 2021-12-23 ENCOUNTER — Encounter: Payer: Self-pay | Admitting: Emergency Medicine

## 2021-12-23 ENCOUNTER — Emergency Department: Payer: BC Managed Care – PPO

## 2021-12-23 ENCOUNTER — Observation Stay
Admit: 2021-12-23 | Discharge: 2021-12-23 | Disposition: A | Discharge status: Home / Self Care | Payer: BC Managed Care – PPO | Attending: Internal Medicine | Admitting: Internal Medicine

## 2021-12-23 ENCOUNTER — Other Ambulatory Visit: Payer: Self-pay

## 2021-12-23 DIAGNOSIS — I10 Essential (primary) hypertension: Secondary | ICD-10-CM | POA: Diagnosis present

## 2021-12-23 DIAGNOSIS — I2510 Atherosclerotic heart disease of native coronary artery without angina pectoris: Secondary | ICD-10-CM | POA: Insufficient documentation

## 2021-12-23 DIAGNOSIS — M47892 Other spondylosis, cervical region: Secondary | ICD-10-CM | POA: Diagnosis not present

## 2021-12-23 DIAGNOSIS — C9000 Multiple myeloma not having achieved remission: Secondary | ICD-10-CM | POA: Insufficient documentation

## 2021-12-23 DIAGNOSIS — R55 Syncope and collapse: Secondary | ICD-10-CM | POA: Diagnosis not present

## 2021-12-23 DIAGNOSIS — Z7901 Long term (current) use of anticoagulants: Secondary | ICD-10-CM | POA: Diagnosis not present

## 2021-12-23 DIAGNOSIS — Z86718 Personal history of other venous thrombosis and embolism: Secondary | ICD-10-CM | POA: Diagnosis not present

## 2021-12-23 DIAGNOSIS — E119 Type 2 diabetes mellitus without complications: Secondary | ICD-10-CM

## 2021-12-23 DIAGNOSIS — I6501 Occlusion and stenosis of right vertebral artery: Secondary | ICD-10-CM | POA: Diagnosis not present

## 2021-12-23 DIAGNOSIS — Z85830 Personal history of malignant neoplasm of bone: Secondary | ICD-10-CM | POA: Diagnosis not present

## 2021-12-23 DIAGNOSIS — I5022 Chronic systolic (congestive) heart failure: Secondary | ICD-10-CM | POA: Diagnosis not present

## 2021-12-23 DIAGNOSIS — Z7984 Long term (current) use of oral hypoglycemic drugs: Secondary | ICD-10-CM | POA: Diagnosis not present

## 2021-12-23 DIAGNOSIS — W19XXXA Unspecified fall, initial encounter: Secondary | ICD-10-CM | POA: Insufficient documentation

## 2021-12-23 DIAGNOSIS — R739 Hyperglycemia, unspecified: Secondary | ICD-10-CM

## 2021-12-23 DIAGNOSIS — I110 Hypertensive heart disease with heart failure: Secondary | ICD-10-CM | POA: Diagnosis not present

## 2021-12-23 DIAGNOSIS — E1165 Type 2 diabetes mellitus with hyperglycemia: Secondary | ICD-10-CM | POA: Diagnosis not present

## 2021-12-23 DIAGNOSIS — Z79899 Other long term (current) drug therapy: Secondary | ICD-10-CM | POA: Insufficient documentation

## 2021-12-23 HISTORY — DX: Malignant (primary) neoplasm, unspecified: C80.1

## 2021-12-23 LAB — URINALYSIS, COMPLETE (UACMP) WITH MICROSCOPIC
Bacteria, UA: NONE SEEN
Bilirubin Urine: NEGATIVE
Glucose, UA: >=500 mg/dL — AB
Hgb urine dipstick: NEGATIVE
Ketones, ur: NEGATIVE mg/dL
Leukocytes,Ua: NEGATIVE
Nitrite: NEGATIVE
Protein, ur: NEGATIVE mg/dL
Specific Gravity, Urine: 1.028 (ref 1.005–1.030)
Squamous Epithelial / HPF: NONE SEEN (ref 0–5)
pH: 5 (ref 5.0–8.0)

## 2021-12-23 LAB — ECHOCARDIOGRAM COMPLETE
AR max vel: 2.16 cm2
AV Area VTI: 2.53 cm2
AV Area mean vel: 1.93 cm2
AV Mean grad: 3 mmHg
AV Peak grad: 4.2 mmHg
Ao pk vel: 1.02 m/s
Area-P 1/2: 2.01 cm2
Height: 69 in
MV VTI: 2.45 cm2
S' Lateral: 3.12 cm
Weight: 3840 oz

## 2021-12-23 LAB — LACTIC ACID, PLASMA: Lactic Acid, Venous: 1.2 mmol/L (ref 0.5–1.9)

## 2021-12-23 LAB — CBC WITH DIFFERENTIAL/PLATELET
Abs Immature Granulocytes: 0.04 10*3/uL (ref 0.00–0.07)
Basophils Absolute: 0 10*3/uL (ref 0.0–0.1)
Basophils Relative: 1 %
Eosinophils Absolute: 0.5 10*3/uL (ref 0.0–0.5)
Eosinophils Relative: 8 %
HCT: 40.9 % (ref 39.0–52.0)
Hemoglobin: 12.5 g/dL — ABNORMAL LOW (ref 13.0–17.0)
Immature Granulocytes: 1 %
Lymphocytes Relative: 14 %
Lymphs Abs: 0.9 10*3/uL (ref 0.7–4.0)
MCH: 27.1 pg (ref 26.0–34.0)
MCHC: 30.6 g/dL (ref 30.0–36.0)
MCV: 88.7 fL (ref 80.0–100.0)
Monocytes Absolute: 0.7 10*3/uL (ref 0.1–1.0)
Monocytes Relative: 12 %
Neutro Abs: 4.2 10*3/uL (ref 1.7–7.7)
Neutrophils Relative %: 64 %
Platelets: 356 10*3/uL (ref 150–400)
RBC: 4.61 MIL/uL (ref 4.22–5.81)
RDW: 18.4 % — ABNORMAL HIGH (ref 11.5–15.5)
WBC: 6.3 10*3/uL (ref 4.0–10.5)
nRBC: 0 % (ref 0.0–0.2)

## 2021-12-23 LAB — COMPREHENSIVE METABOLIC PANEL
ALT: 15 U/L (ref 0–44)
AST: 14 U/L — ABNORMAL LOW (ref 15–41)
Albumin: 3.6 g/dL (ref 3.5–5.0)
Alkaline Phosphatase: 89 U/L (ref 38–126)
Anion gap: 5 (ref 5–15)
BUN: 17 mg/dL (ref 8–23)
CO2: 25 mmol/L (ref 22–32)
Calcium: 9.5 mg/dL (ref 8.9–10.3)
Chloride: 104 mmol/L (ref 98–111)
Creatinine, Ser: 1.05 mg/dL (ref 0.61–1.24)
GFR, Estimated: >60 mL/min (ref >60)
Glucose, Bld: 237 mg/dL — ABNORMAL HIGH (ref 70–99)
Potassium: 4.4 mmol/L (ref 3.5–5.1)
Sodium: 134 mmol/L — ABNORMAL LOW (ref 135–145)
Total Bilirubin: 1.2 mg/dL (ref 0.3–1.2)
Total Protein: 6.6 g/dL (ref 6.5–8.1)

## 2021-12-23 LAB — HEMOGLOBIN A1C
Hgb A1c MFr Bld: 10.4 % — ABNORMAL HIGH (ref 4.8–5.6)
Mean Plasma Glucose: 251.78 mg/dL

## 2021-12-23 LAB — TROPONIN I (HIGH SENSITIVITY)
Troponin I (High Sensitivity): 9 ng/L (ref <18)
Troponin I (High Sensitivity): 8 ng/L (ref <18)

## 2021-12-23 LAB — GLUCOSE, CAPILLARY
Glucose-Capillary: 166 mg/dL — ABNORMAL HIGH (ref 70–99)
Glucose-Capillary: 164 mg/dL — ABNORMAL HIGH (ref 70–99)

## 2021-12-23 LAB — PROTIME-INR
INR: 1.9 — ABNORMAL HIGH (ref 0.8–1.2)
Prothrombin Time: 21.2 seconds — ABNORMAL HIGH (ref 11.4–15.2)

## 2021-12-23 LAB — APTT: aPTT: 60 seconds — ABNORMAL HIGH (ref 24–36)

## 2021-12-23 MED ORDER — INSULIN ASPART 100 UNIT/ML IJ SOLN
0.0000 [IU] | Freq: Three times a day (TID) | INTRAMUSCULAR | Status: DC
  Administered 2021-12-24: 2 [IU] via SUBCUTANEOUS
  Administered 2021-12-24: 3 [IU] via SUBCUTANEOUS
  Filled 2021-12-23 (×2): qty 1

## 2021-12-23 MED ORDER — ONDANSETRON HCL 4 MG PO TABS: 4.0000 mg | ORAL_TABLET | Freq: Four times a day (QID) | ORAL | Status: DC | PRN

## 2021-12-23 MED ORDER — ALLOPURINOL 300 MG PO TABS: 450.0000 mg | ORAL_TABLET | Freq: Every day | ORAL | Status: DC

## 2021-12-23 MED ORDER — PANTOPRAZOLE SODIUM 40 MG PO TBEC
40.0000 mg | DELAYED_RELEASE_TABLET | Freq: Every day | ORAL | Status: DC
  Administered 2021-12-24: 40 mg via ORAL
  Filled 2021-12-23: qty 1

## 2021-12-23 MED ORDER — ACETAMINOPHEN 650 MG RE SUPP: 650.0000 mg | Freq: Four times a day (QID) | RECTAL | Status: DC | PRN

## 2021-12-23 MED ORDER — SODIUM CHLORIDE 0.9% FLUSH
3.0000 mL | Freq: Two times a day (BID) | INTRAVENOUS | Status: DC
  Administered 2021-12-23 – 2021-12-24 (×2): 3 mL via INTRAVENOUS

## 2021-12-23 MED ORDER — PREGABALIN 75 MG PO CAPS
150.0000 mg | ORAL_CAPSULE | Freq: Three times a day (TID) | ORAL | Status: DC
  Administered 2021-12-23 – 2021-12-24 (×2): 150 mg via ORAL
  Filled 2021-12-23 (×2): qty 2

## 2021-12-23 MED ORDER — ACETAMINOPHEN 500 MG PO TABS
1000.0000 mg | ORAL_TABLET | Freq: Once | ORAL | Status: AC
  Administered 2021-12-23: 1000 mg via ORAL
  Filled 2021-12-23: qty 2

## 2021-12-23 MED ORDER — RIVAROXABAN 20 MG PO TABS
20.0000 mg | ORAL_TABLET | Freq: Every day | ORAL | Status: DC
  Filled 2021-12-23: qty 1

## 2021-12-23 MED ORDER — DULOXETINE HCL 60 MG PO CPEP: 60.0000 mg | ORAL_CAPSULE | Freq: Every day | ORAL | Status: DC

## 2021-12-23 MED ORDER — RIVAROXABAN 20 MG PO TABS: 20.0000 mg | ORAL_TABLET | Freq: Every day | ORAL | Status: DC

## 2021-12-23 MED ORDER — ALLOPURINOL 100 MG PO TABS
450.0000 mg | ORAL_TABLET | Freq: Every day | ORAL | Status: DC
  Administered 2021-12-24: 450 mg via ORAL
  Filled 2021-12-23: qty 5

## 2021-12-23 MED ORDER — CHLORHEXIDINE GLUCONATE CLOTH 2 % EX PADS
6.0000 | MEDICATED_PAD | Freq: Every day | CUTANEOUS | Status: DC
  Administered 2021-12-24: 6 via TOPICAL

## 2021-12-23 MED ORDER — IOHEXOL 350 MG/ML SOLN
100.0000 mL | Freq: Once | INTRAVENOUS | Status: AC | PRN
  Administered 2021-12-23: 100 mL via INTRAVENOUS

## 2021-12-23 MED ORDER — ACETAMINOPHEN 325 MG PO TABS
650.0000 mg | ORAL_TABLET | Freq: Four times a day (QID) | ORAL | Status: DC | PRN
  Administered 2021-12-23 – 2021-12-24 (×2): 650 mg via ORAL
  Filled 2021-12-23 (×2): qty 2

## 2021-12-23 MED ORDER — VITAMIN B-12 1000 MCG PO TABS
1000.0000 ug | ORAL_TABLET | Freq: Every day | ORAL | Status: DC
  Administered 2021-12-24: 1000 ug via ORAL
  Filled 2021-12-23: qty 1

## 2021-12-23 MED ORDER — LACTATED RINGERS IV BOLUS
1000.0000 mL | Freq: Once | INTRAVENOUS | Status: AC
  Administered 2021-12-23: 1000 mL via INTRAVENOUS

## 2021-12-23 MED ORDER — SODIUM CHLORIDE 0.9 % IV SOLN: INTRAVENOUS | Status: AC

## 2021-12-23 MED ORDER — ONDANSETRON HCL 4 MG/2ML IJ SOLN: 4.0000 mg | Freq: Four times a day (QID) | INTRAMUSCULAR | Status: DC | PRN

## 2021-12-23 MED ORDER — DULOXETINE HCL 30 MG PO CPEP
60.0000 mg | ORAL_CAPSULE | Freq: Every day | ORAL | Status: DC
  Administered 2021-12-24: 60 mg via ORAL
  Filled 2021-12-23: qty 2

## 2021-12-23 NOTE — ED Notes (Signed)
Patient to CT at this time

## 2021-12-23 NOTE — Assessment & Plan Note (Signed)
Stable and not acutely exacerbated Last 2D echocardiogram shows an LVEF of 35% Hold furosemide, spironolactone, Entresto and carvedilol Monitor volume status closely

## 2021-12-23 NOTE — ED Provider Notes (Signed)
[facility] Provider Note    Event Date/Time   First MD Initiated Contact with Patient 12/23/21 [phone]     (approximate)   History   Near Syncope   HPI  Kevin Lowery is a 61 y.o. male past no history of arthritis, gout, DVT on Xarelto most recent dose earlier this morning, HTN, HDL and multiple myeloma undergoing chemotherapy at [facility] in [place] who presents for evaluation from home via EMS after a fall and near syncopal episode.  Patient states this morning he experienced some worse acute on chronic lower neck and upper back pain before passing out.  He is fairly certain he had LOC and hit his head but he states he only has pain at this time in his lower neck and upper back.  No recent fevers, cough, nausea, vomiting, diarrhea, urinary symptoms, chest pain, abdominal pain rash or any focal extremity weakness numbness or tingling vertigo or visual symptoms.  No recent alcohol use EtOH use or illicit drug use or any other recent falls or syncopal episodes      Physical Exam  Triage Vital Signs: ED Triage Vitals  Enc Vitals Group     BP --      Pulse --      Resp --      Temp --      Temp src --      SpO2 12/23/21 0831 96 %     Weight 12/23/21 0834 240 lb (108.9 kg)     Height 12/23/21 0834 '5\' 9"'  (1.753 m)     Head Circumference --      [facility] Flow --      Pain Score 12/23/21 0833 4     Pain Loc --      Pain Edu? --      Excl. in [place]? --     Most recent vital signs: Vitals:   12/23/21 1000 12/23/21 1100  BP: 118/77 127/82  Pulse: 73 70  Resp: 18 (!) 27  Temp:    SpO2: 98% 100%    General: Awake, no distress.  Awake and alert. CV:  Good peripheral perfusion.  2+ radial and PT pulses. Resp:  Normal effort.  Clear bilaterally. Abd:  No distention.  Soft. Other:   CN II-XII grossly intact  No tenderness/deformities over the C/T/L spine  No focal TTP over b/l shoulders, elbows, wrists, hips, knees, ankles  2+ b/l radial and PD pulses   No  other obvious trauma to face, scalp ,head, neck or torso  No pronator drift or finger dysmetria.   ED Results / Procedures / Treatments  Labs (all labs ordered are listed, but only abnormal results are displayed) Labs Reviewed  CBC WITH DIFFERENTIAL/PLATELET - Abnormal; Notable for the following components:      Result Value   Hemoglobin 12.5 (*)    RDW 18.4 (*)    All other components within normal limits  COMPREHENSIVE METABOLIC PANEL - Abnormal; Notable for the following components:   Sodium 134 (*)    Glucose, Bld 237 (*)    AST 14 (*)    All other components within normal limits  APTT - Abnormal; Notable for the following components:   aPTT 60 (*)    All other components within normal limits  PROTIME-INR - Abnormal; Notable for the following components:   Prothrombin Time 21.2 (*)    INR 1.9 (*)    All other components within normal limits  URINALYSIS, COMPLETE (UACMP) WITH MICROSCOPIC -  Abnormal; Notable for the following components:   Color, Urine STRAW (*)    APPearance CLEAR (*)    Glucose, UA >=500 (*)    All other components within normal limits  LACTIC ACID, PLASMA  TYPE AND SCREEN  TROPONIN I (HIGH SENSITIVITY)  TROPONIN I (HIGH SENSITIVITY)     EKG  ECG from over sinus rhythm with a ventricular rate of 78, normal axis, unremarkable intervals without clear evidence of acute ischemia or significant arrhythmia.   RADIOLOGY  CT head and C-spine my interpretation without evidence of a skull fracture or acute C-spine injury or evidence of acute intracranial hemorrhage edema or mass effect or acute ischemia.  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  CT chest abdomen pelvis my review without large PE, dissection, pericardial effusion, aneurysm, hematoma or clear focal consolidation in the lungs pneumothorax  rib fracture or acute spinal fracture.  I reviewed radiology interpretation and agree their findings of some atherosclerosis, CAD, hepatic steatosis and groundglass opacities in both lung consistent with possible pneumonitis or atypical infection as well as unchanged sclerotic disease and lytic lesions of L2 and the right ischium.  CTA neck, review without evidence of dissection or does appear to be 0 significant stenosis of the right vertebral artery.  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   PROCEDURES:  Critical Care performed: No  .1-3 Lead EKG Interpretation  Performed by: Lucrezia Starch, MD Authorized by: Lucrezia Starch, MD     Interpretation: normal     ECG rate assessment: normal     Rhythm: sinus rhythm     Ectopy: none     Conduction: normal     The patient is on the cardiac monitor to evaluate for evidence of arrhythmia and/or significant heart rate changes.   MEDICATIONS ORDERED IN ED: Medications  lactated ringers bolus 1,000 mL (1,000 mLs Intravenous New Bag/Given 12/23/21 0919)  iohexol (OMNIPAQUE) 350 MG/ML injection 100 mL (100 mLs Intravenous Contrast Given 12/23/21 0852)  acetaminophen (TYLENOL) tablet 1,000 mg (1,000 mg Oral Given 12/23/21 1048)     IMPRESSION / MDM / ASSESSMENT AND PLAN / ED COURSE  I reviewed the triage vital signs and the nursing notes. Patient's presentation is most consistent with acute presentation with potential threat to life or bodily function.                               Differential diagnosis includes, but is not limited to be secondary to anemia, dehydration, electrolyte derangements, arrhythmia, ACS and dissection with a lower suspicion for PE given patient reports compliant with his Xarelto.  In addition considerations given syncope and neck and back pain after a fall including possible head  injury and concern for possible occult skull fracture, intracranial hemorrhage, C-spine injury as well as occult visceral trauma such as a hematoma.  ECG from over sinus rhythm with a ventricular rate of 78, normal axis, unremarkable intervals without clear evidence of acute ischemia or significant arrhythmia.  Troponin nonelevated is not suggestive of ACS.  CMP is remarkable for glucose of 237 without any other significant electrolyte or metabolic derangements.  CBC shows no leukocytosis and hemoglobin of 12.5 compared to 13 months ago.  Normal platelets.  [place] is nonelevated.  PTT is 60.  INR is 1.9.  CT head and C-spine my interpretation without evidence of a skull fracture or acute C-spine injury or evidence of acute intracranial hemorrhage edema or mass effect or acute ischemia.  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  CT chest abdomen pelvis my review without large PE, dissection, pericardial effusion, aneurysm, hematoma or clear focal consolidation in the lungs pneumothorax rib fracture or acute spinal fracture.  I reviewed radiology interpretation and agree their findings of some atherosclerosis, CAD, hepatic steatosis and groundglass opacities in both lung consistent with possible pneumonitis or atypical infection as well as unchanged sclerotic disease and lytic lesions of L2 and the right ischium.  CTA neck, review without evidence of dissection or does appear to be 0 significant stenosis of the right vertebral artery.  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  Given findings of severe stenosis on CTA neck I did reach out to on-call vascular surgeon Dr. Lucky Cowboy who will evaluate patient to see if he is a  candidate for intervention.  Given severe stenosis and syncope he also probably reasonable to observe the hospital service.  I think this is very reasonable.  I will admit to medicine service for further evaluation and management.         FINAL CLINICAL IMPRESSION(S) / ED DIAGNOSES   Final diagnoses:  Syncope and collapse  Fall, initial encounter  Hyperglycemia  Anticoagulated  Stenosis of right vertebral artery  Coronary artery disease involving native heart without angina pectoris, unspecified vessel or lesion type     Rx / DC Orders   ED Discharge Orders     None        Note:  This document was prepared using Dragon voice recognition software and may include unintentional dictation errors.   Lucrezia Starch, MD 12/23/21 [phone]

## 2021-12-23 NOTE — Consult Note (Signed)
CARDIOLOGY CONSULT NOTE               Patient ID: Kevin Lowery MRN: [id number] DOB/AGE: October 31, 1960 61 y.o.  Admit date: 12/23/2021 Referring Physician Dr. Marthenia Rolling hospitalist Primary Physician Amalia Greenhouse MD primary Primary Cardiologist [facility] Reason for Consultation near syncope vasovagal  HPI: 61 year old male significant medical problems currently being treated for multiple myeloma on chemotherapy history of DVT chronic LUQ early coagulation with Xarelto he has history of hypertension obesity nonischemic cardiomyopathy ejection fraction around 35% patient also being treated for diabetes and hyperlipidemia patient reportedly was at home and started complaining of neck pain and back pain over the last few weeks patient woke up that morning when trying to put his pants on to go to work and unable to flex his neck due to pain and fell to the ground in the sitting position due to weakness in his legs he did not lose any consciousness was fully aware did not suffer any injuries patient had urged to get up and go to the bathroom and his wife is there to try to help him did not hit his head once he Removed he got lightheaded weak and may have had a slight blackout spell according to the wife he was out for few seconds he was incontinent of stool which was soft and loose he had jerking movements in his extremities did not bite his tongue by time EMS arrived his blood pressure was 95 systolic and he was transported to the emergency room denies any recent fever no previous nausea vomiting or diarrhea no chest pain no palpitations or tachycardia  Review of systems complete and found to be negative unless listed above     Past Medical History:  Diagnosis Date   Arthritis    Cancer ([place])    Gout    Gout    History of DVT of lower extremity    Anticoagulated for a couple of months   Hyperlipidemia    Hypertension     Past Surgical History:  Procedure Laterality Date   KNEE ARTHROSCOPY      right    Medications Prior to Admission  Medication Sig Dispense Refill Last Dose   allopurinol (ZYLOPRIM) 300 MG tablet Take 450 mg by mouth daily.  0 12/23/2021   carvedilol (COREG) 25 MG tablet Take 1.5 tablets by mouth in the morning and at bedtime.   12/23/2021   cyanocobalamin 1000 MCG tablet Take 1,000 mcg by mouth daily.   12/23/2021   DULoxetine (CYMBALTA) 60 MG capsule Take 60 mg by mouth daily.   12/23/2021   ENTRESTO 49-51 MG Take 1 tablet by mouth 2 (two) times daily.   12/23/2021   furosemide (LASIX) 20 MG tablet Take 20 mg by mouth daily.   12/23/2021   JARDIANCE 10 MG TABS tablet Take 10 mg by mouth daily.   12/23/2021   pantoprazole (PROTONIX) 40 MG tablet Take 40 mg by mouth daily.   12/23/2021   POMALYST 3 MG capsule Take by mouth.   12/22/2021 at 1700   potassium chloride SA (KLOR-CON) 20 MEQ tablet Take 20 mEq by mouth 2 (two) times daily.   12/23/2021   pregabalin (LYRICA) 150 MG capsule Take 150 mg by mouth 3 (three) times daily.   12/23/2021   spironolactone (ALDACTONE) 25 MG tablet Take 25 mg by mouth daily.   12/23/2021   XARELTO 20 MG TABS tablet Take 20 mg by mouth daily with supper.   12/23/2021   Social History  Socioeconomic History   Marital status: Married    Spouse name: Not on file   Number of children: Not on file   Years of education: Not on file   Highest education level: Not on file  Occupational History   Not on file  Tobacco Use   Smoking status: Never   Smokeless tobacco: Never  Substance and Sexual Activity   Alcohol use: No   Drug use: No   Sexual activity: Not on file  Other Topics Concern   Not on file  Social History Narrative   Not on file   Social Determinants of Health   Financial Resource Strain: Not on file  Food Insecurity: Not on file  Transportation Needs: Not on file  Physical Activity: Not on file  Stress: Not on file  Social Connections: Not on file  Intimate Partner Violence: Not on file    Family History  Problem  Relation Age of Onset   Prostate cancer Father    Stroke Father    Seizures Father    Diabetes Mother    Cancer Mother       Review of systems complete and found to be negative unless listed above      PHYSICAL EXAM  General: Well developed, well nourished, in no acute distress HEENT:  Normocephalic and atramatic Neck:  No JVD.  Lungs: Clear bilaterally to auscultation and percussion. Heart: HRRR . Normal S1 and S2 without gallops or murmurs.  Abdomen: Bowel sounds are positive, abdomen soft and non-tender  Msk:  Back normal, normal gait. Normal strength and tone for age. Extremities: No clubbing, cyanosis or edema.   Neuro: Alert and oriented X 3. Psych:  Good affect, responds appropriately  Labs:   Lab Results  Component Value Date   WBC 6.3 12/23/2021   HGB 12.5 (L) 12/23/2021   HCT 40.9 12/23/2021   MCV 88.7 12/23/2021   PLT 356 12/23/2021    Recent Labs  Lab 12/23/21 0840  NA 134*  K 4.4  CL 104  CO2 25  BUN 17  CREATININE 1.05  CALCIUM 9.5  PROT 6.6  BILITOT 1.2  ALKPHOS 89  ALT 15  AST 14*  GLUCOSE 237*   No results found for: "CKTOTAL", "CKMB", "CKMBINDEX", "TROPONINI" No results found for: "CHOL" No results found for: "HDL" No results found for: "LDLCALC" No results found for: "TRIG" No results found for: "CHOLHDL" No results found for: "LDLDIRECT"    Radiology: ECHOCARDIOGRAM COMPLETE  Result Date: 12/23/2021    ECHOCARDIOGRAM REPORT   Patient Name:   Kevin Lowery Date of Exam: 12/23/2021 Medical Rec #:  [id number]     Height:       69.0 in Accession #:    [id number]    Weight:       240.0 lb Date of Birth:  September 24, 1960    BSA:          2.232 m Patient Age:    69 years      BP:           121/78 mmHg Patient Gender: M             HR:           75 bpm. Exam Location:  [facility] Procedure: 2D Echo, Cardiac Doppler and Color Doppler Indications:     R55 Syncope  History:         Patient has no prior history of Echocardiogram examinations.  CHF, Multiple myeloma; Risk Factors:Dyslipidemia, Hypertension                  and Diabetes.  Sonographer:     Rosalia Hammers Referring Phys:  [id number] NIDPOEUM AGBATA Diagnosing Phys: Yolonda Kida MD  Sonographer Comments: Suboptimal apical window and suboptimal subcostal window. Image acquisition challenging due to patient body habitus and Image acquisition challenging due to respiratory motion. IMPRESSIONS  1. Left ventricular ejection fraction, by estimation, is 60 to 65%. The left ventricle has normal function. The left ventricle has no regional wall motion abnormalities. There is mild concentric left ventricular hypertrophy. Left ventricular diastolic parameters are consistent with Grade I diastolic dysfunction (impaired relaxation).  2. Right ventricular systolic function is normal. The right ventricular size is normal.  3. The mitral valve is normal in structure. Trivial mitral valve regurgitation.  4. The aortic valve is normal in structure. Aortic valve regurgitation is not visualized. FINDINGS  Left Ventricle: Left ventricular ejection fraction, by estimation, is 60 to 65%. The left ventricle has normal function. The left ventricle has no regional wall motion abnormalities. The left ventricular internal cavity size was normal in size. There is  mild concentric left ventricular hypertrophy. Left ventricular diastolic parameters are consistent with Grade I diastolic dysfunction (impaired relaxation). Right Ventricle: The right ventricular size is normal. No increase in right ventricular wall thickness. Right ventricular systolic function is normal. Left Atrium: Left atrial size was normal in size. Right Atrium: Right atrial size was normal in size. Pericardium: There is no evidence of pericardial effusion. Mitral Valve: The mitral valve is normal in structure. Trivial mitral valve regurgitation. MV peak gradient, 2.9 mmHg. The mean mitral valve gradient is 1.0 mmHg. Tricuspid Valve: The tricuspid valve  is normal in structure. Tricuspid valve regurgitation is mild. Aortic Valve: The aortic valve is normal in structure. Aortic valve regurgitation is not visualized. Aortic valve mean gradient measures 3.0 mmHg. Aortic valve peak gradient measures 4.2 mmHg. Aortic valve area, by VTI measures 2.53 cm. Pulmonic Valve: The pulmonic valve was normal in structure. Pulmonic valve regurgitation is not visualized. Aorta: The ascending aorta was not well visualized. IAS/Shunts: No atrial level shunt detected by color flow Doppler.  LEFT VENTRICLE PLAX 2D LVIDd:         4.63 cm   Diastology LVIDs:         3.12 cm   LV e' medial:    7.51 cm/s LV PW:         1.17 cm   LV E/e' medial:  5.7 LV IVS:        1.34 cm   LV e' lateral:   7.62 cm/s LVOT diam:     1.90 cm   LV E/e' lateral: 5.6 LV SV:         49 LV SV Index:   22 LVOT Area:     2.84 cm  RIGHT VENTRICLE RV S prime:     18.60 cm/s LEFT ATRIUM             Index LA diam:        3.20 cm 1.43 cm/m LA Vol (A2C):   40.6 ml 18.19 ml/m LA Vol (A4C):   22.9 ml 10.26 ml/m LA Biplane Vol: 30.4 ml 13.62 ml/m  AORTIC VALVE                    PULMONIC VALVE AV Area (Vmax):    2.16 cm     PV Vmax:  0.74 m/s AV Area (Vmean):   1.93 cm     PV Vmean:      48.300 cm/s AV Area (VTI):     2.53 cm     PV VTI:        0.133 m AV Vmax:           102.00 cm/s  PV Peak grad:  2.2 mmHg AV Vmean:          75.500 cm/s  PV Mean grad:  1.0 mmHg AV VTI:            0.195 m AV Peak Grad:      4.2 mmHg AV Mean Grad:      3.0 mmHg LVOT Vmax:         77.60 cm/s LVOT Vmean:        51.300 cm/s LVOT VTI:          0.174 m LVOT/AV VTI ratio: 0.89  AORTA Ao Root diam: 3.00 cm MITRAL VALVE MV Area (PHT): 2.01 cm    SHUNTS MV Area VTI:   2.45 cm    Systemic VTI:  0.17 m MV Peak grad:  2.9 mmHg    Systemic Diam: 1.90 cm MV Mean grad:  1.0 mmHg MV Vmax:       0.85 m/s MV Vmean:      44.7 cm/s MV Decel Time: 378 msec MV E velocity: 42.80 cm/s MV A velocity: 78.80 cm/s MV E/A ratio:  0.54 Micole Delehanty D Mayci Haning MD  Electronically signed by Yolonda Kida MD Signature Date/Time: 12/23/2021/5:23:56 PM    Final    CT Angio Neck W and/or Wo Contrast  Result Date: 12/23/2021 CLINICAL DATA:  Syncope common back/neck pain EXAM: CT ANGIOGRAPHY NECK TECHNIQUE: Multidetector CT imaging of the neck was performed using the standard protocol during bolus administration of intravenous contrast. Multiplanar CT image reconstructions and MIPs were obtained to evaluate the vascular anatomy. Carotid stenosis measurements (when applicable) are obtained utilizing NASCET criteria, using the distal internal carotid diameter as the denominator. RADIATION DOSE REDUCTION: This exam was performed according to the departmental dose-optimization program which includes automated exposure control, adjustment of the mA and/or kV according to patient size and/or use of iterative reconstruction technique. CONTRAST:  112m OMNIPAQUE IOHEXOL 350 MG/ML SOLN COMPARISON:  No direct comparison study available. Cervical spine CT 01/29/2020. FINDINGS: Aortic arch: The imaged aortic arch is normal. The aortic arch is evaluated in full on the separately dictated CTA chest/abdomen/pelvis. The origins of the major branch vessels are patent. Subclavian arteries are patent to the level imaged. Right carotid system: The right common, internal, and external carotid arteries are patent, without hemodynamically significant stenosis or occlusion. There is no dissection or aneurysm. Left carotid system: The left common, internal, and external carotid arteries are patent, without hemodynamically significant stenosis or occlusion. There is no dissection or aneurysm. Vertebral arteries: There is severe stenosis of the right V1 segment just after the origin (7-85, 6-161). The right vertebral artery distal to this point is patent. The left vertebral artery is patent. There is a small nonocclusive filling defect in the proximal basilar artery best appreciated on the coronal  images which could reflect nonocclusive thrombus (7-97, 6-29). The basilar artery distal to this point is patent. Skeleton: There is no acute fracture or traumatic malalignment of the cervical spine. Other neck: The soft tissues are unremarkable. Upper chest: The lungs are assessed on the separately dictated CTA chest/abdomen/pelvis. IMPRESSION: 1. 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 2. Small hypodense filling defect in the proximal basilar artery suspicious for nonocclusive thrombus. The distal basilar artery is patent. 3. Otherwise, patent vasculature of the neck. These results were called by telephone at the time of interpretation on 12/23/2021 at 10:50 am to provider [facility] , who verbally acknowledged these results. Electronically Signed   By: Valetta Mole M.D.   On: 12/23/2021 10:50   CT T-SPINE NO CHARGE  Result Date: 12/23/2021 CLINICAL DATA:  Syncopal episode, back and neck pain, trauma multiple myeloma * Tracking Code: BO * EXAM: CT ANGIOGRAPHY CHEST, ABDOMEN AND PELVIS CT THORACIC SPINE WITHOUT CONTRAST TECHNIQUE: Non-contrast CT of the chest was initially obtained. Multidetector CT imaging through the chest, abdomen and pelvis was performed using the standard protocol during bolus administration of intravenous contrast. Multiplanar reconstructed images and MIPs were obtained and reviewed to evaluate the vascular anatomy. Multi detector CT imaging of the thoracic spine was performed using the standard protocol prior to administration of intravenous contrast. RADIATION DOSE REDUCTION: This exam was performed according to the departmental dose-optimization program which includes automated exposure control, adjustment of the mA and/or kV according to patient size and/or use of iterative reconstruction technique. CONTRAST:  12m OMNIPAQUE IOHEXOL 350 MG/ML SOLN COMPARISON:  CT abdomen pelvis, 03/23/2021, CT chest angiogram, 12/28/2018 FINDINGS: CTA CHEST  FINDINGS VASCULAR Aorta: Satisfactory opacification of the aorta. Normal contour and caliber of the thoracic aorta. No evidence of aneurysm, dissection, or other acute aortic pathology. Cardiovascular: Right chest port catheter. No evidence of pulmonary embolism on limited non-tailored examination. Normal heart size. Left coronary artery calcifications. No pericardial effusion. Review of the MIP images confirms the above findings. NON VASCULAR Mediastinum/Nodes: No enlarged mediastinal, hilar, or axillary lymph nodes. Thyroid gland, trachea, and esophagus demonstrate no significant findings. Lungs/Pleura: Extensive fine centrilobular nodularity and ground-glass throughout the lungs without clear apical to basal gradient, new compared to prior examination. Bland appearing, bandlike scarring of the bilateral lung bases. No pleural effusion or pneumothorax. Musculoskeletal: No chest wall abnormality. No acute osseous findings. Review of the MIP images confirms the above findings. CTA ABDOMEN AND PELVIS FINDINGS VASCULAR Normal contour and caliber of the abdominal aorta. No evidence of aneurysm, dissection, or other acute aortic pathology. Standard branching pattern of the abdominal aorta with solitary bilateral renal arteries. Mild scattered atherosclerosis. Review of the MIP images confirms the above findings. NON-VASCULAR Hepatobiliary: No solid liver abnormality is seen. Hepatic steatosis. Status post cholecystectomy with unchanged postoperative parenchymal scarring of the anterior left lobe of the liver (series 10, image 135). No biliary ductal dilatation. Pancreas: Unremarkable. No pancreatic ductal dilatation or surrounding inflammatory changes. Spleen: Normal in size without significant abnormality. Adrenals/Urinary Tract: Adrenal glands are unremarkable. Kidneys are normal, without renal calculi, solid lesion, or hydronephrosis. Bladder is unremarkable. Stomach/Bowel: Stomach is within normal limits. Appendix  appears normal. No evidence of bowel wall thickening, distention, or inflammatory changes. Lymphatic: No enlarged abdominal or pelvic lymph nodes. Reproductive: No mass or other significant abnormality. Other: No abdominal wall hernia or abnormality. No ascites. Musculoskeletal: No acute osseous findings. Diffuse osseous sclerosis with unchanged lytic lesions of the L2 vertebral body (series 14, image 66) and the right ischium (series 10, image 307). CT THORACIC SPINE FINDINGS Alignment: Normal thoracic kyphosis. Vertebral bodies: Diffusely sclerotic vertebral bodies. No evidence of fracture or thoracic spine lytic lesion. Disc spaces: Mild disc space height loss and osteophytosis throughout the thoracic spine. Paraspinous soft tissues: Unremarkable. IMPRESSION: 1. Normal contour and caliber  of the thoracic and abdominal aorta, without evidence of aneurysm, dissection, or other acute aortic pathology. Mild, scattered atherosclerosis. 2. No fracture or dislocation of the thoracic spine. Mild multilevel disc degenerative disease. 3. Extensive fine centrilobular nodularity and ground-glass throughout the lungs, new compared to prior examination and consistent with atypical infection or inhalational inflammation such as hypersensitivity pneumonitis. 4. Diffuse osseous sclerosis with unchanged lytic lesions of the L2 vertebral body and the right ischium, consistent with known diagnosis of multiple myeloma. No new osseous lesions or evidence of soft tissue involvement on this arterial phase examination. 5. Hepatic steatosis. 6. Coronary artery disease. Aortic Atherosclerosis (ICD10-I70.0). Electronically Signed   By: Delanna Ahmadi M.D.   On: 12/23/2021 10:30   CT Angio Chest/Abd/Pel for Dissection W and/or W/WO  Result Date: 12/23/2021 CLINICAL DATA:  Syncopal episode, back and neck pain, trauma multiple myeloma * Tracking Code: BO * EXAM: CT ANGIOGRAPHY CHEST, ABDOMEN AND PELVIS CT THORACIC SPINE WITHOUT CONTRAST  TECHNIQUE: Non-contrast CT of the chest was initially obtained. Multidetector CT imaging through the chest, abdomen and pelvis was performed using the standard protocol during bolus administration of intravenous contrast. Multiplanar reconstructed images and MIPs were obtained and reviewed to evaluate the vascular anatomy. Multi detector CT imaging of the thoracic spine was performed using the standard protocol prior to administration of intravenous contrast. RADIATION DOSE REDUCTION: This exam was performed according to the departmental dose-optimization program which includes automated exposure control, adjustment of the mA and/or kV according to patient size and/or use of iterative reconstruction technique. CONTRAST:  136m OMNIPAQUE IOHEXOL 350 MG/ML SOLN COMPARISON:  CT abdomen pelvis, 03/23/2021, CT chest angiogram, 12/28/2018 FINDINGS: CTA CHEST FINDINGS VASCULAR Aorta: Satisfactory opacification of the aorta. Normal contour and caliber of the thoracic aorta. No evidence of aneurysm, dissection, or other acute aortic pathology. Cardiovascular: Right chest port catheter. No evidence of pulmonary embolism on limited non-tailored examination. Normal heart size. Left coronary artery calcifications. No pericardial effusion. Review of the MIP images confirms the above findings. NON VASCULAR Mediastinum/Nodes: No enlarged mediastinal, hilar, or axillary lymph nodes. Thyroid gland, trachea, and esophagus demonstrate no significant findings. Lungs/Pleura: Extensive fine centrilobular nodularity and ground-glass throughout the lungs without clear apical to basal gradient, new compared to prior examination. Bland appearing, bandlike scarring of the bilateral lung bases. No pleural effusion or pneumothorax. Musculoskeletal: No chest wall abnormality. No acute osseous findings. Review of the MIP images confirms the above findings. CTA ABDOMEN AND PELVIS FINDINGS VASCULAR Normal contour and caliber of the abdominal aorta.  No evidence of aneurysm, dissection, or other acute aortic pathology. Standard branching pattern of the abdominal aorta with solitary bilateral renal arteries. Mild scattered atherosclerosis. Review of the MIP images confirms the above findings. NON-VASCULAR Hepatobiliary: No solid liver abnormality is seen. Hepatic steatosis. Status post cholecystectomy with unchanged postoperative parenchymal scarring of the anterior left lobe of the liver (series 10, image 135). No biliary ductal dilatation. Pancreas: Unremarkable. No pancreatic ductal dilatation or surrounding inflammatory changes. Spleen: Normal in size without significant abnormality. Adrenals/Urinary Tract: Adrenal glands are unremarkable. Kidneys are normal, without renal calculi, solid lesion, or hydronephrosis. Bladder is unremarkable. Stomach/Bowel: Stomach is within normal limits. Appendix appears normal. No evidence of bowel wall thickening, distention, or inflammatory changes. Lymphatic: No enlarged abdominal or pelvic lymph nodes. Reproductive: No mass or other significant abnormality. Other: No abdominal wall hernia or abnormality. No ascites. Musculoskeletal: No acute osseous findings. Diffuse osseous sclerosis with unchanged lytic lesions of the L2 vertebral body (series 14, image  66) and the right ischium (series 10, image 307). CT THORACIC SPINE FINDINGS Alignment: Normal thoracic kyphosis. Vertebral bodies: Diffusely sclerotic vertebral bodies. No evidence of fracture or thoracic spine lytic lesion. Disc spaces: Mild disc space height loss and osteophytosis throughout the thoracic spine. Paraspinous soft tissues: Unremarkable. IMPRESSION: 1. Normal contour and caliber of the thoracic and abdominal aorta, without evidence of aneurysm, dissection, or other acute aortic pathology. Mild, scattered atherosclerosis. 2. No fracture or dislocation of the thoracic spine. Mild multilevel disc degenerative disease. 3. Extensive fine centrilobular  nodularity and ground-glass throughout the lungs, new compared to prior examination and consistent with atypical infection or inhalational inflammation such as hypersensitivity pneumonitis. 4. Diffuse osseous sclerosis with unchanged lytic lesions of the L2 vertebral body and the right ischium, consistent with known diagnosis of multiple myeloma. No new osseous lesions or evidence of soft tissue involvement on this arterial phase examination. 5. Hepatic steatosis. 6. Coronary artery disease. Aortic Atherosclerosis (ICD10-I70.0). Electronically Signed   By: Delanna Ahmadi M.D.   On: 12/23/2021 10:30   CT Cervical Spine Wo Contrast  Result Date: 12/23/2021 CLINICAL DATA:  Syncope common back/neck pain EXAM: CT CERVICAL SPINE WITHOUT CONTRAST TECHNIQUE: Multidetector CT imaging of the cervical spine was performed without intravenous contrast. Multiplanar CT image reconstructions were also generated. RADIATION DOSE REDUCTION: This exam was performed according to the departmental dose-optimization program which includes automated exposure control, adjustment of the mA and/or kV according to patient size and/or use of iterative reconstruction technique. COMPARISON:  Cervical spine CT 01/29/2020 FINDINGS: Alignment: There is slight reversal of the normal cervical spine curvature centered at C5. There is no antero or retrolisthesis. There is no jumped or perched facets or other evidence of traumatic malalignment. Skull base and vertebrae: Skull base alignment is maintained. Vertebral body heights are preserved. There is no evidence of acute fracture. Soft tissues and spinal canal: No prevertebral fluid or swelling. No visible canal hematoma. Disc levels: There is disc space narrowing with associated degenerative endplate change most advanced at C5-C6 through C7-T1. Facet arthropathy is most advanced on the left at C3-C4. There is no evidence of high-grade spinal canal stenosis. Upper chest: The lungs are assessed on the  separately dictated CTA chest. Other: None. IMPRESSION: No acute fracture or traumatic malalignment of the cervical spine. Electronically Signed   By: Valetta Mole M.D.   On: 12/23/2021 10:27   CT HEAD WO CONTRAST (5MM)  Result Date: 12/23/2021 CLINICAL DATA:  Syncope, neck/back pain EXAM: CT HEAD WITHOUT CONTRAST TECHNIQUE: Contiguous axial images were obtained from the base of the skull through the vertex without intravenous contrast. RADIATION DOSE REDUCTION: This exam was performed according to the departmental dose-optimization program which includes automated exposure control, adjustment of the mA and/or kV according to patient size and/or use of iterative reconstruction technique. COMPARISON:  CT head 01/29/2020 FINDINGS: Brain: There is no acute intracranial hemorrhage, extra-axial fluid collection, or acute infarct. Background parenchymal volume is normal. The ventricles are normal in size. Encephalomalacia in the left anterior frontal lobe is unchanged. 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 There is no mass lesion.  There is no mass effect or midline shift. Vascular: No hyperdense vessel or unexpected calcification. Skull: Normal. Negative for fracture or focal lesion. Sinuses/Orbits: The imaged paranasal sinuses are clear. The imaged globes and orbits are unremarkable. Other: None. IMPRESSION: 1. No acute intracranial pathology. 2. 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 Encephalomalacia in the left anterior frontal lobe is unchanged. Electronically Signed   By: Valetta Mole M.D.   On: 12/23/2021 10:23    EKG: Normal sinus rhythm rate of around 80 with nonspecific ST-T wave changes  ASSESSMENT AND PLAN:  Near Syncope Vasovagal syncope Peripheral vascular disease Nonischemic cardiomyopathy Congestive heart failure systolic dysfunction History of  DVT Hypertension Multiple Myeloma Diabetes type 2 Obesity . 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 as an outpatient  Signed: Yolonda Kida MD, 12/23/2021, 6:18 PM

## 2021-12-23 NOTE — H&P (Signed)
History and Physical    Patient: Kevin Lowery GBT:[id number] DOB: [birth year] DOA: 12/23/2021 DOS: the patient was seen and examined on 12/23/2021 PCP: Freida Busman, MD  Patient coming from: Home  Chief Complaint:  Chief Complaint  Patient presents with   Near Syncope   HPI: Kevin Lowery is a 61 y.o. male with medical history significant for multiple myeloma on chemotherapy, history of DVT on chronic anticoagulation therapy, hypertension who presents to the ER for evaluation for evaluation of a witnessed syncopal episode. Patient states that he was in his usual state of health prior to going to bed the day before his admission but notes that he has had worsening neck and back pain for the last 1 week.  He has a history of chronic pain related to his multiple myeloma but states that the intensity of the pain increased in the last 1 week.  He had taken some acetaminophen and went to bed.  He woke up on the morning of his admission and was trying to put his pants on to go to work, he notes that he was unable to flex his neck due to pain and fell to the ground from a sitting position due to weakness.  He denied any loss of consciousness during the first episode but felt very weak and unable to get up.  His wife who witnessed this asked that he sit on the ground for a while but he had to get up because he had an urge to use the bathroom (needed to have a bowel movement).  He got up to go to the bathroom and his wife states that he he fell and hit his head on the wall and lost consciousness.  He was also incontinent of stool which she said was soft but not loose.  He had no jerking movements involving his extremities and there was no tongue bite.  She called EMS. When EMS arrived his blood pressure was 95/50 and so he was transported to the ER for further evaluation. He has nausea but denies having any vomiting, no diarrhea, no fever, no chills, no hematuria, no changes in his bowel habits, no  headache, no dizziness, no lightheadedness, no blurred vision, no leg swelling or any focal deficit. Review of Systems: As mentioned in the history of present illness. All other systems reviewed and are negative. Past Medical History:  Diagnosis Date   Arthritis    Cancer ([place])    Gout    Gout    History of DVT of lower extremity    Anticoagulated for a couple of months   Hyperlipidemia    Hypertension    Past Surgical History:  Procedure Laterality Date   KNEE ARTHROSCOPY     right   Social History:  reports that he has never smoked. He has never used smokeless tobacco. He reports that he does not drink alcohol and does not use drugs.  Allergies  Allergen Reactions   Hydrocodone Nausea And Vomiting   Oxycodone Nausea And Vomiting    Family History  Problem Relation Age of Onset   Prostate cancer Father    Stroke Father    Seizures Father    Diabetes Mother    Cancer Mother     Prior to Admission medications   Medication Sig Start Date End Date Taking? Authorizing Provider  allopurinol (ZYLOPRIM) 300 MG tablet Take 450 mg by mouth daily. 08/17/17   [provider]  carvedilol (COREG) 25 MG tablet Take 1.5 tablets by mouth  in the morning and at bedtime. 12/28/20   [provider]  cyanocobalamin 1000 MCG tablet Take 1,000 mcg by mouth daily.    [provider]  DULoxetine (CYMBALTA) 60 MG capsule Take 60 mg by mouth daily. 01/28/21   [provider]  ENTRESTO 49-51 MG Take 1 tablet by mouth 2 (two) times daily. 01/21/21   [provider]  furosemide (LASIX) 20 MG tablet Take 20 mg by mouth daily. 01/21/21   [provider]  JARDIANCE 10 MG TABS tablet Take 10 mg by mouth daily. 02/22/21   [provider]  pantoprazole (PROTONIX) 40 MG tablet Take 40 mg by mouth daily. 01/31/21   [provider]  POMALYST 3 MG capsule Take by mouth. Patient not taking: No sig reported 02/19/21   [provider]   potassium chloride SA (KLOR-CON) 20 MEQ tablet Take 20 mEq by mouth 2 (two) times daily. 12/30/20   [provider]  pregabalin (LYRICA) 150 MG capsule Take 150 mg by mouth 3 (three) times daily. 03/11/21   [provider]  spironolactone (ALDACTONE) 25 MG tablet Take 25 mg by mouth daily. 01/18/21   [provider]  XARELTO 20 MG TABS tablet Take 20 mg by mouth daily as needed. 01/23/21   [provider]    Physical Exam: Vitals:   12/23/21 1000 12/23/21 1100 12/23/21 1200 12/23/21 1230  BP: 118/77 127/82 132/88 121/78  Pulse: 73 70 68 72  Resp: 18 (!) 27 (!) 24 (!) 25  Temp:      TempSrc:      SpO2: 98% 100% 95% 96%  Weight:      Height:       Physical Exam Vitals and nursing note reviewed.  Constitutional:      Appearance: He is obese.  HENT:     Head: Normocephalic and atraumatic.     Nose: Nose normal.     Mouth/Throat:     Mouth: Mucous membranes are moist.  Eyes:     Comments: Pale conjunctiva  Cardiovascular:     Rate and Rhythm: Normal rate and regular rhythm.  Pulmonary:     Effort: Pulmonary effort is normal.     Breath sounds: Normal breath sounds.  Abdominal:     General: Bowel sounds are normal.     Palpations: Abdomen is soft.     Comments: Central adiposity  Musculoskeletal:        General: Normal range of motion.     Cervical back: Normal range of motion and neck supple.  Skin:    General: Skin is warm and dry.  Neurological:     General: No focal deficit present.     Mental Status: He is alert and oriented to person, place, and time.  Psychiatric:        Mood and Affect: Mood normal.        Behavior: Behavior normal.     Data Reviewed: Relevant notes from primary care and specialist visits, past discharge summaries as available in EHR, including Care Everywhere. Prior diagnostic testing as pertinent to current admission diagnoses Updated medications and problem lists for reconciliation ED course, including  vitals, labs, imaging, treatment and response to treatment Triage notes, nursing and pharmacy notes and ED provider's notes Notable results as noted in HPI Labs reviewed.  PT 21.2, INR 1.9, sodium 134, potassium 4.4, chloride 104, bicarb 25, glucose 237, BUN 17, creatinine 1.05, calcium 9.5, total protein 6.6, albumin 3.6, AST 14, ALT 15, alk  phos 89, total bilirubin 1.2, lactic acid 1.2, white count 6.3, hemoglobin 12.5, hematocrit 40.9, RDW 18.4, platelet count 356 CT scan of the head without contrast shows no acute intracranial pathology. 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 Encephalomalacia in the left anterior frontal lobe is unchanged. Cervical spine CT shows no acute fracture or traumatic malalignment of the cervical spine. CT angiogram of chest/abdomen/pelvis shows normal contour and caliber of the thoracic and abdominal aorta, without evidence of aneurysm, dissection, or other acute aortic pathology. Mild, scattered atherosclerosis. No fracture or dislocation of the thoracic spine. Mild multilevel disc degenerative disease. Extensive fine centrilobular nodularity and ground-glass throughout the lungs, new compared to prior examination and consistent with atypical infection or inhalational inflammation such as hypersensitivity pneumonitis.Diffuse osseous sclerosis with unchanged lytic lesions of the L2 vertebral body and the right ischium, consistent with known diagnosis of multiple myeloma. No new osseous lesions or evidence of soft tissue involvement on this arterial phase examination. Hepatic steatosis. Coronary artery disease. Aortic Atherosclerosis . 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 Small hypodense filling defect in the proximal basilar artery suspicious for nonocclusive thrombus. The distal basilar artery is patent. Otherwise, patent vasculature of the  neck. Twelve-lead EKG reviewed by me shows normal sinus rhythm. There are no new results to review at this time.  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  Chronic systolic CHF (congestive heart failure) ([place]) Stable and not acutely exacerbated Last 2D echocardiogram shows an LVEF of 35% Hold furosemide, spironolactone, Entresto and carvedilol Monitor volume status closely  History of DVT of lower extremity Stable Continue Xarelto  Hypertension Hold all antihypertensive medications for now since patient was hypotensive upon presentation but is now normotensive  Multiple myeloma without remission ([place]) History of multiple myeloma on chemotherapy Follow-up with oncology as an outpatient  Diabetes mellitus ([place]) Place patient on a consistent carbohydrate diet Glycemic control with sliding scale insulin      Advance Care Planning:   Code Status: Full Code   Consults: Cardiology  Family Communication: Greater than 50% of time was spent discussing patient's condition and plan of care with him at the bedside.  All questions and concerns have been addressed.  He verbalizes understanding and agrees to the plan.  Severity of Illness: The appropriate patient status for this patient is OBSERVATION. Observation status is judged to be reasonable and necessary in order to provide the required intensity of  service to ensure the patient's safety. The patient's presenting symptoms, physical exam findings, and initial radiographic and laboratory data in the context of their medical condition is felt to place them at decreased risk for further clinical deterioration. Furthermore, it is anticipated that the patient will be medically stable for discharge from the hospital within 2 midnights of admission.   Author: Collier Bullock, MD 12/23/2021 1:54 PM  For on call review [email or web address].

## 2021-12-23 NOTE — ED Triage Notes (Signed)
Patient to ED via [facility] from home for near syncopal episode. Pt complaining of back/neck pain. Hx of bone cancer Bp initially 95/50

## 2021-12-23 NOTE — Assessment & Plan Note (Addendum)
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

## 2021-12-23 NOTE — Assessment & Plan Note (Signed)
Hold all antihypertensive medications for now since patient was hypotensive upon presentation but is now normotensive

## 2021-12-23 NOTE — Assessment & Plan Note (Signed)
History of multiple myeloma on chemotherapy Follow-up with oncology as an outpatient

## 2021-12-23 NOTE — Consult Note (Signed)
[facility] Consult Note  MRN : [id number]  Fuad Forget is a 61 y.o. ([birth year]) male who presents with chief complaint of  Chief Complaint  Patient presents with   Near Syncope  .   Consulting Physician: Collier Bullock, MD Reason for consult: Vertebral artery stenosis History of Present Illness: Nickalas Mccarrick is a 61 year old male that presents to [facility] following a syncopal event.  The patient has a history of arthritis, gout, hypertension, hyperlipidemia, and DVT which is being treated with Xarelto.  The patient also has multiple myeloma and he is currently undergoing chemotherapy at [facility].  The patient notes that he notably has chronic lower neck and back pain.  He notes that before this incident the pain suddenly became severe.  He notes that he lost consciousness and was able to move.  He also notes a loss of bladder control at that time.  Prior to this incident he denies any dizziness, upper extremity numbness or tingling, vertigo or any other focal neurological symptoms.  CT scan independently reviewed by myself and Dr. Lucky Cowboy indicates a proximal right vertebral artery stenosis, however the vertebral artery itself is small.  The left vertebral artery appears to be a more dominant artery which is widely patent.  The bilateral internal carotid arteries are also widely patent.  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 No current facility-administered medications for this encounter.   Current Outpatient Medications  Medication Sig Dispense Refill   allopurinol (ZYLOPRIM) 300 MG tablet Take 450 mg by mouth daily.  0   carvedilol (COREG) 25 MG tablet Take 1.5 tablets by mouth in the morning and at bedtime.     cyanocobalamin 1000 MCG tablet Take 1,000 mcg by mouth daily.     DULoxetine (CYMBALTA) 60 MG capsule Take 60 mg by mouth daily.     ENTRESTO 49-51 MG Take 1 tablet by mouth 2 (two) times daily.      furosemide (LASIX) 20 MG tablet Take 20 mg by mouth daily.     JARDIANCE 10 MG TABS tablet Take 10 mg by mouth daily.     pantoprazole (PROTONIX) 40 MG tablet Take 40 mg by mouth daily.     POMALYST 3 MG capsule Take by mouth. (Patient not taking: No sig reported)     potassium chloride SA (KLOR-CON) 20 MEQ tablet Take 20 mEq by mouth 2 (two) times daily.     pregabalin (LYRICA) 150 MG capsule Take 150 mg by mouth 3 (three) times daily.     spironolactone (ALDACTONE) 25 MG tablet Take 25 mg by mouth daily.     XARELTO 20 MG TABS tablet Take 20 mg by mouth daily as needed.      Past Medical History:  Diagnosis Date   Arthritis    Cancer ([place])    Gout    Gout    History of DVT of lower extremity    Anticoagulated for a couple of months   Hyperlipidemia    Hypertension     Past Surgical History:  Procedure Laterality Date   KNEE ARTHROSCOPY     right    Social History Social History   Tobacco Use   Smoking status: Never   Smokeless tobacco: Never  Substance Use Topics   Alcohol use: No   Drug use: No    Family History Family History  Problem Relation Age of Onset   Prostate cancer Father    Stroke Father  Seizures Father    Diabetes Mother    Cancer Mother     Allergies  Allergen Reactions   Hydrocodone Nausea And Vomiting   Oxycodone Nausea And Vomiting     REVIEW OF SYSTEMS (Negative unless checked)  Constitutional: '[]' Weight loss  '[]' Fever  '[]' Chills Cardiac: '[]' Chest pain   '[]' Chest pressure   '[]' Palpitations   '[]' Shortness of breath when laying flat   '[]' Shortness of breath at rest   '[]' Shortness of breath with exertion. Vascular:  '[]' Pain in legs with walking   '[]' Pain in legs at rest   '[]' Pain in legs when laying flat   '[]' Claudication   '[]' Pain in feet when walking  '[]' Pain in feet at rest  '[]' Pain in feet when laying flat   '[]' History of DVT   '[]' Phlebitis   '[]' Swelling in legs   '[]' Varicose veins   '[]' Non-healing ulcers Pulmonary:   '[]' Uses home oxygen    '[]' Productive cough   '[]' Hemoptysis   '[]' Wheeze  '[]' COPD   '[]' Asthma Neurologic:  '[]' Dizziness  '[]' Blackouts   '[]' Seizures   '[]' History of stroke   '[]' History of TIA  '[]' Aphasia   '[]' Temporary blindness   '[]' Dysphagia   '[]' Weakness or numbness in arms   '[]' Weakness or numbness in legs Musculoskeletal:  '[x]' Arthritis   '[]' Joint swelling   '[]' Joint pain   '[]' Low back pain Hematologic:  '[]' Easy bruising  '[]' Easy bleeding   '[]' Hypercoagulable state   '[]' Anemic  '[]' Hepatitis Gastrointestinal:  '[]' Blood in stool   '[]' Vomiting blood  '[]' Gastroesophageal reflux/heartburn   '[]' Difficulty swallowing. Genitourinary:  '[]' Chronic kidney disease   '[]' Difficult urination  '[]' Frequent urination  '[]' Burning with urination   '[]' Blood in urine Skin:  '[]' Rashes   '[]' Ulcers   '[]' Wounds Psychological:  '[]' History of anxiety   '[]'  History of major depression.  Physical Examination  Vitals:   12/23/21 1000 12/23/21 1100 12/23/21 1200 12/23/21 1230  BP: 118/77 127/82 132/88 121/78  Pulse: 73 70 68 72  Resp: 18 (!) 27 (!) 24 (!) 25  Temp:      TempSrc:      SpO2: 98% 100% 95% 96%  Weight:      Height:       Body mass index is 35.44 kg/m. Gen:  WD/WN, NAD Head: [place]/AT, No temporalis wasting. Prominent temp pulse not noted. Ear/Nose/Throat: Hearing grossly intact, nares w/o erythema or drainage, oropharynx w/o Erythema/Exudate Eyes: Sclera non-icteric, conjunctiva clear Neck: Trachea midline.  No JVD.  Pulmonary:  Good air movement, respirations not labored, equal bilaterally.  Cardiac: RRR, normal S1, S2. Vascular:  Vessel Right Left  Radial Palpable Palpable  PT Palpable Palpable  DP Palpable Palpable   Gastrointestinal: soft, non-tender/non-distended. No guarding/reflex.  Musculoskeletal: M/S 5/5 throughout.  Extremities without ischemic changes.  No deformity or atrophy. No edema. Neurologic: Sensation grossly intact in extremities.  Symmetrical.  Speech is fluent. Motor exam as listed above. Psychiatric: Judgment intact, Mood & affect  appropriate for pt's clinical situation. Dermatologic: No rashes or ulcers noted.  No cellulitis or open wounds. Lymph : No Cervical, Axillary, or Inguinal lymphadenopathy.    CBC Lab Results  Component Value Date   WBC 6.3 12/23/2021   HGB 12.5 (L) 12/23/2021   HCT 40.9 12/23/2021   MCV 88.7 12/23/2021   PLT 356 12/23/2021    BMET    Component Value Date/Time   NA 134 (L) 12/23/2021 0840   NA 139 12/23/2013 0756   K 4.4 12/23/2021 0840   K 3.8 12/23/2013 0756   CL 104 12/23/2021 0840   CL 105  12/23/2013 0756   CO2 25 12/23/2021 0840   CO2 26 12/23/2013 0756   GLUCOSE 237 (H) 12/23/2021 0840   GLUCOSE 109 (H) 12/23/2013 0756   BUN 17 12/23/2021 0840   BUN 14 12/23/2013 0756   CREATININE 1.05 12/23/2021 0840   CREATININE 1.08 12/23/2013 0756   CALCIUM 9.5 12/23/2021 0840   CALCIUM 9.3 12/23/2013 0756   GFRNONAA >60 12/23/2021 0840   GFRNONAA >60 12/23/2013 0756   GFRAA >60 02/24/2020 0713   GFRAA >60 12/23/2013 0756   Estimated Creatinine Clearance: 89.9 mL/min (by C-G formula based on SCr of 1.05 mg/dL).  COAG Lab Results  Component Value Date   INR 1.9 (H) 12/23/2021   INR 1.5 (H) 03/23/2021    Radiology CT Angio Neck W and/or Wo Contrast  Result Date: 12/23/2021 CLINICAL DATA:  Syncope common back/neck pain EXAM: CT ANGIOGRAPHY NECK TECHNIQUE: Multidetector CT imaging of the neck was performed using the standard protocol during bolus administration of intravenous contrast. Multiplanar CT image reconstructions and MIPs were obtained to evaluate the vascular anatomy. Carotid stenosis measurements (when applicable) are obtained utilizing NASCET criteria, using the distal internal carotid diameter as the denominator. RADIATION DOSE REDUCTION: This exam was performed according to the departmental dose-optimization program which includes automated exposure control, adjustment of the mA and/or kV according to patient size and/or use of iterative reconstruction  technique. CONTRAST:  175m OMNIPAQUE IOHEXOL 350 MG/ML SOLN COMPARISON:  No direct comparison study available. Cervical spine CT 01/29/2020. FINDINGS: Aortic arch: The imaged aortic arch is normal. The aortic arch is evaluated in full on the separately dictated CTA chest/abdomen/pelvis. The origins of the major branch vessels are patent. Subclavian arteries are patent to the level imaged. Right carotid system: The right common, internal, and external carotid arteries are patent, without hemodynamically significant stenosis or occlusion. There is no dissection or aneurysm. Left carotid system: The left common, internal, and external carotid arteries are patent, without hemodynamically significant stenosis or occlusion. There is no dissection or aneurysm. Vertebral arteries: There is severe stenosis of the right V1 segment just after the origin (7-85, 6-161). The right vertebral artery distal to this point is patent. The left vertebral artery is patent. There is a small nonocclusive filling defect in the proximal basilar artery best appreciated on the coronal images which could reflect nonocclusive thrombus (7-97, 6-29). The basilar artery distal to this point is patent. Skeleton: There is no acute fracture or traumatic malalignment of the cervical spine. Other neck: The soft tissues are unremarkable. Upper chest: The lungs are assessed on the separately dictated CTA chest/abdomen/pelvis. IMPRESSION: 1. 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 2. Small hypodense filling defect in the proximal basilar artery suspicious for nonocclusive thrombus. The distal basilar artery is patent. 3. Otherwise, patent vasculature of the neck. These results were called by telephone at the time of interpretation on 12/23/2021 at 10:50 am [facility] who verbally acknowledged these results. Electronically Signed   By: PValetta MoleM.D.   On: 12/23/2021 10:50   CT T-SPINE NO  CHARGE  Result Date: 12/23/2021 CLINICAL DATA:  Syncopal episode, back and neck pain, trauma multiple myeloma * Tracking Code: BO * EXAM: CT ANGIOGRAPHY CHEST, ABDOMEN AND PELVIS CT THORACIC SPINE WITHOUT CONTRAST TECHNIQUE: Non-contrast CT of the chest was initially obtained. Multidetector CT imaging through the chest, abdomen and pelvis was performed using the standard protocol during bolus administration of intravenous contrast. Multiplanar reconstructed images and MIPs were  obtained and reviewed to evaluate the vascular anatomy. Multi detector CT imaging of the thoracic spine was performed using the standard protocol prior to administration of intravenous contrast. RADIATION DOSE REDUCTION: This exam was performed according to the departmental dose-optimization program which includes automated exposure control, adjustment of the mA and/or kV according to patient size and/or use of iterative reconstruction technique. CONTRAST:  15m OMNIPAQUE IOHEXOL 350 MG/ML SOLN COMPARISON:  CT abdomen pelvis, 03/23/2021, CT chest angiogram, 12/28/2018 FINDINGS: CTA CHEST FINDINGS VASCULAR Aorta: Satisfactory opacification of the aorta. Normal contour and caliber of the thoracic aorta. No evidence of aneurysm, dissection, or other acute aortic pathology. Cardiovascular: Right chest port catheter. No evidence of pulmonary embolism on limited non-tailored examination. Normal heart size. Left coronary artery calcifications. No pericardial effusion. Review of the MIP images confirms the above findings. NON VASCULAR Mediastinum/Nodes: No enlarged mediastinal, hilar, or axillary lymph nodes. Thyroid gland, trachea, and esophagus demonstrate no significant findings. Lungs/Pleura: Extensive fine centrilobular nodularity and ground-glass throughout the lungs without clear apical to basal gradient, new compared to prior examination. Bland appearing, bandlike scarring of the bilateral lung bases. No pleural effusion or pneumothorax.  Musculoskeletal: No chest wall abnormality. No acute osseous findings. Review of the MIP images confirms the above findings. CTA ABDOMEN AND PELVIS FINDINGS VASCULAR Normal contour and caliber of the abdominal aorta. No evidence of aneurysm, dissection, or other acute aortic pathology. Standard branching pattern of the abdominal aorta with solitary bilateral renal arteries. Mild scattered atherosclerosis. Review of the MIP images confirms the above findings. NON-VASCULAR Hepatobiliary: No solid liver abnormality is seen. Hepatic steatosis. Status post cholecystectomy with unchanged postoperative parenchymal scarring of the anterior left lobe of the liver (series 10, image 135). No biliary ductal dilatation. Pancreas: Unremarkable. No pancreatic ductal dilatation or surrounding inflammatory changes. Spleen: Normal in size without significant abnormality. Adrenals/Urinary Tract: Adrenal glands are unremarkable. Kidneys are normal, without renal calculi, solid lesion, or hydronephrosis. Bladder is unremarkable. Stomach/Bowel: Stomach is within normal limits. Appendix appears normal. No evidence of bowel wall thickening, distention, or inflammatory changes. Lymphatic: No enlarged abdominal or pelvic lymph nodes. Reproductive: No mass or other significant abnormality. Other: No abdominal wall hernia or abnormality. No ascites. Musculoskeletal: No acute osseous findings. Diffuse osseous sclerosis with unchanged lytic lesions of the L2 vertebral body (series 14, image 66) and the right ischium (series 10, image 307). CT THORACIC SPINE FINDINGS Alignment: Normal thoracic kyphosis. Vertebral bodies: Diffusely sclerotic vertebral bodies. No evidence of fracture or thoracic spine lytic lesion. Disc spaces: Mild disc space height loss and osteophytosis throughout the thoracic spine. Paraspinous soft tissues: Unremarkable. IMPRESSION: 1. Normal contour and caliber of the thoracic and abdominal aorta, without evidence of  aneurysm, dissection, or other acute aortic pathology. Mild, scattered atherosclerosis. 2. No fracture or dislocation of the thoracic spine. Mild multilevel disc degenerative disease. 3. Extensive fine centrilobular nodularity and ground-glass throughout the lungs, new compared to prior examination and consistent with atypical infection or inhalational inflammation such as hypersensitivity pneumonitis. 4. Diffuse osseous sclerosis with unchanged lytic lesions of the L2 vertebral body and the right ischium, consistent with known diagnosis of multiple myeloma. No new osseous lesions or evidence of soft tissue involvement on this arterial phase examination. 5. Hepatic steatosis. 6. Coronary artery disease. Aortic Atherosclerosis (ICD10-I70.0). Electronically Signed   By: ADelanna AhmadiM.D.   On: 12/23/2021 10:30   CT Angio Chest/Abd/Pel for Dissection W and/or W/WO  Result Date: 12/23/2021 CLINICAL DATA:  Syncopal episode, back and neck pain, trauma  multiple myeloma * Tracking Code: BO * EXAM: CT ANGIOGRAPHY CHEST, ABDOMEN AND PELVIS CT THORACIC SPINE WITHOUT CONTRAST TECHNIQUE: Non-contrast CT of the chest was initially obtained. Multidetector CT imaging through the chest, abdomen and pelvis was performed using the standard protocol during bolus administration of intravenous contrast. Multiplanar reconstructed images and MIPs were obtained and reviewed to evaluate the vascular anatomy. Multi detector CT imaging of the thoracic spine was performed using the standard protocol prior to administration of intravenous contrast. RADIATION DOSE REDUCTION: This exam was performed according to the departmental dose-optimization program which includes automated exposure control, adjustment of the mA and/or kV according to patient size and/or use of iterative reconstruction technique. CONTRAST:  131m OMNIPAQUE IOHEXOL 350 MG/ML SOLN COMPARISON:  CT abdomen pelvis, 03/23/2021, CT chest angiogram, 12/28/2018 FINDINGS: CTA  CHEST FINDINGS VASCULAR Aorta: Satisfactory opacification of the aorta. Normal contour and caliber of the thoracic aorta. No evidence of aneurysm, dissection, or other acute aortic pathology. Cardiovascular: Right chest port catheter. No evidence of pulmonary embolism on limited non-tailored examination. Normal heart size. Left coronary artery calcifications. No pericardial effusion. Review of the MIP images confirms the above findings. NON VASCULAR Mediastinum/Nodes: No enlarged mediastinal, hilar, or axillary lymph nodes. Thyroid gland, trachea, and esophagus demonstrate no significant findings. Lungs/Pleura: Extensive fine centrilobular nodularity and ground-glass throughout the lungs without clear apical to basal gradient, new compared to prior examination. Bland appearing, bandlike scarring of the bilateral lung bases. No pleural effusion or pneumothorax. Musculoskeletal: No chest wall abnormality. No acute osseous findings. Review of the MIP images confirms the above findings. CTA ABDOMEN AND PELVIS FINDINGS VASCULAR Normal contour and caliber of the abdominal aorta. No evidence of aneurysm, dissection, or other acute aortic pathology. Standard branching pattern of the abdominal aorta with solitary bilateral renal arteries. Mild scattered atherosclerosis. Review of the MIP images confirms the above findings. NON-VASCULAR Hepatobiliary: No solid liver abnormality is seen. Hepatic steatosis. Status post cholecystectomy with unchanged postoperative parenchymal scarring of the anterior left lobe of the liver (series 10, image 135). No biliary ductal dilatation. Pancreas: Unremarkable. No pancreatic ductal dilatation or surrounding inflammatory changes. Spleen: Normal in size without significant abnormality. Adrenals/Urinary Tract: Adrenal glands are unremarkable. Kidneys are normal, without renal calculi, solid lesion, or hydronephrosis. Bladder is unremarkable. Stomach/Bowel: Stomach is within normal limits.  Appendix appears normal. No evidence of bowel wall thickening, distention, or inflammatory changes. Lymphatic: No enlarged abdominal or pelvic lymph nodes. Reproductive: No mass or other significant abnormality. Other: No abdominal wall hernia or abnormality. No ascites. Musculoskeletal: No acute osseous findings. Diffuse osseous sclerosis with unchanged lytic lesions of the L2 vertebral body (series 14, image 66) and the right ischium (series 10, image 307). CT THORACIC SPINE FINDINGS Alignment: Normal thoracic kyphosis. Vertebral bodies: Diffusely sclerotic vertebral bodies. No evidence of fracture or thoracic spine lytic lesion. Disc spaces: Mild disc space height loss and osteophytosis throughout the thoracic spine. Paraspinous soft tissues: Unremarkable. IMPRESSION: 1. Normal contour and caliber of the thoracic and abdominal aorta, without evidence of aneurysm, dissection, or other acute aortic pathology. Mild, scattered atherosclerosis. 2. No fracture or dislocation of the thoracic spine. Mild multilevel disc degenerative disease. 3. Extensive fine centrilobular nodularity and ground-glass throughout the lungs, new compared to prior examination and consistent with atypical infection or inhalational inflammation such as hypersensitivity pneumonitis. 4. Diffuse osseous sclerosis with unchanged lytic lesions of the L2 vertebral body and the right ischium, consistent with known diagnosis of multiple myeloma. No new osseous lesions or evidence of soft  tissue involvement on this arterial phase examination. 5. Hepatic steatosis. 6. Coronary artery disease. Aortic Atherosclerosis (ICD10-I70.0). Electronically Signed   By: Delanna Ahmadi M.D.   On: 12/23/2021 10:30   CT Cervical Spine Wo Contrast  Result Date: 12/23/2021 CLINICAL DATA:  Syncope common back/neck pain EXAM: CT CERVICAL SPINE WITHOUT CONTRAST TECHNIQUE: Multidetector CT imaging of the cervical spine was performed without intravenous contrast.  Multiplanar CT image reconstructions were also generated. RADIATION DOSE REDUCTION: This exam was performed according to the departmental dose-optimization program which includes automated exposure control, adjustment of the mA and/or kV according to patient size and/or use of iterative reconstruction technique. COMPARISON:  Cervical spine CT 01/29/2020 FINDINGS: Alignment: There is slight reversal of the normal cervical spine curvature centered at C5. There is no antero or retrolisthesis. There is no jumped or perched facets or other evidence of traumatic malalignment. Skull base and vertebrae: Skull base alignment is maintained. Vertebral body heights are preserved. There is no evidence of acute fracture. Soft tissues and spinal canal: No prevertebral fluid or swelling. No visible canal hematoma. Disc levels: There is disc space narrowing with associated degenerative endplate change most advanced at C5-C6 through C7-T1. Facet arthropathy is most advanced on the left at C3-C4. There is no evidence of high-grade spinal canal stenosis. Upper chest: The lungs are assessed on the separately dictated CTA chest. Other: None. IMPRESSION: No acute fracture or traumatic malalignment of the cervical spine. Electronically Signed   By: Valetta Mole M.D.   On: 12/23/2021 10:27   CT HEAD WO CONTRAST (5MM)  Result Date: 12/23/2021 CLINICAL DATA:  Syncope, neck/back pain EXAM: CT HEAD WITHOUT CONTRAST TECHNIQUE: Contiguous axial images were obtained from the base of the skull through the vertex without intravenous contrast. RADIATION DOSE REDUCTION: This exam was performed according to the departmental dose-optimization program which includes automated exposure control, adjustment of the mA and/or kV according to patient size and/or use of iterative reconstruction technique. COMPARISON:  CT head 01/29/2020 FINDINGS: Brain: There is no acute intracranial hemorrhage, extra-axial fluid collection, or acute infarct. Background  parenchymal volume is normal. The ventricles are normal in size. Encephalomalacia in the left anterior frontal lobe is unchanged. 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 There is no mass lesion.  There is no mass effect or midline shift. Vascular: No hyperdense vessel or unexpected calcification. Skull: Normal. Negative for fracture or focal lesion. Sinuses/Orbits: The imaged paranasal sinuses are clear. The imaged globes and orbits are unremarkable. Other: None. IMPRESSION: 1. No acute intracranial pathology. 2. 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 Encephalomalacia in the left anterior frontal lobe is unchanged. Electronically Signed   By: Valetta Mole M.D.   On: 12/23/2021 10:23      Assessment/Plan 1.  Vertebral artery stenosis  The CT scan indicates a severe vertebral artery stenosis.  However, the vertebral appears small in nature with more dominant left vertebral artery.  Also the bilateral internal carotid arteries are widely patent.  Based on this we do not recommend any intervention at this time on the vertebral artery.  It is also not felt that this is the cause of the patient's neck pain or syncopal episode.  The noted basilar abnormality should be treated medically as well.  We do recommend anticoagulation.  The patient is already on Xarelto and this should be continued.  We also recommend the addition of a daily 81 mg aspirin  as well as a statin.  We will follow patient on an outpatient basis with carotid duplex.  2.  Osteoarthritis of cervical spine  The patient has a long history of neck pain.  We do not believe that the vertebral artery stenosis is the cause of his current pain but more so in extension of his osteoarthritis.  3.  Hyperlipidemia Recommend statin to help with progression of atherosclerotic disease.  Family Communication: No family at  bedside  Thank you for allowing [place] to participate in the care of this patient.   Kris Hartmann, NP [facility] [phone] (Office Phone) [phone] (Office Fax)  12/23/2021 1:15 PM  Staff may message me via secure chat in [place]  but this may not receive immediate response,  please page for urgent matters!  Dictation software was used to generate the above note. Typos may occur and escape review, as with typed/written notes. Any error is purely unintentional.  Please contact me directly for clarity if needed.

## 2021-12-23 NOTE — Assessment & Plan Note (Signed)
Stable Continue Xarelto 

## 2021-12-23 NOTE — Assessment & Plan Note (Signed)
Place patient on a consistent carbohydrate diet Glycemic control with sliding scale insulin

## 2021-12-24 ENCOUNTER — Encounter: Payer: Self-pay | Admitting: Internal Medicine

## 2021-12-24 DIAGNOSIS — Z86718 Personal history of other venous thrombosis and embolism: Secondary | ICD-10-CM

## 2021-12-24 DIAGNOSIS — R55 Syncope and collapse: Secondary | ICD-10-CM | POA: Diagnosis not present

## 2021-12-24 LAB — GLUCOSE, CAPILLARY
Glucose-Capillary: 152 mg/dL — ABNORMAL HIGH (ref 70–99)
Glucose-Capillary: 128 mg/dL — ABNORMAL HIGH (ref 70–99)

## 2021-12-24 LAB — BASIC METABOLIC PANEL
Anion gap: 6 (ref 5–15)
BUN: 15 mg/dL (ref 8–23)
CO2: 25 mmol/L (ref 22–32)
Calcium: 9.1 mg/dL (ref 8.9–10.3)
Chloride: 106 mmol/L (ref 98–111)
Creatinine, Ser: 0.84 mg/dL (ref 0.61–1.24)
GFR, Estimated: >60 mL/min (ref >60)
Glucose, Bld: 158 mg/dL — ABNORMAL HIGH (ref 70–99)
Potassium: 3.9 mmol/L (ref 3.5–5.1)
Sodium: 137 mmol/L (ref 135–145)

## 2021-12-24 LAB — CBC
HCT: 38.5 % — ABNORMAL LOW (ref 39.0–52.0)
Hemoglobin: 12.1 g/dL — ABNORMAL LOW (ref 13.0–17.0)
MCH: 27.2 pg (ref 26.0–34.0)
MCHC: 31.4 g/dL (ref 30.0–36.0)
MCV: 86.5 fL (ref 80.0–100.0)
Platelets: 334 10*3/uL (ref 150–400)
RBC: 4.45 MIL/uL (ref 4.22–5.81)
RDW: 18 % — ABNORMAL HIGH (ref 11.5–15.5)
WBC: 6 10*3/uL (ref 4.0–10.5)
nRBC: 0 % (ref 0.0–0.2)

## 2021-12-24 LAB — TYPE AND SCREEN
ABO/RH(D): A POS
Antibody Screen: POSITIVE

## 2021-12-24 LAB — HIV ANTIBODY (ROUTINE TESTING W REFLEX): HIV Screen 4th Generation wRfx: NONREACTIVE

## 2021-12-24 MED ORDER — GLIPIZIDE 5 MG PO TABS
5.0000 mg | ORAL_TABLET | Freq: Two times a day (BID) | ORAL | Status: DC
  Administered 2021-12-24: 5 mg via ORAL
  Filled 2021-12-24 (×2): qty 1

## 2021-12-24 MED ORDER — METFORMIN HCL ER 750 MG PO TB24
750.0000 mg | ORAL_TABLET | Freq: Every day | ORAL | Status: DC
  Administered 2021-12-24: 750 mg via ORAL
  Filled 2021-12-24: qty 1

## 2021-12-24 MED ORDER — GLIPIZIDE 5 MG PO TABS: 5.0000 mg | ORAL_TABLET | Freq: Two times a day (BID) | ORAL | 0 refills | Status: AC

## 2021-12-24 MED ORDER — METFORMIN HCL ER 750 MG PO TB24: 750.0000 mg | ORAL_TABLET | Freq: Every day | ORAL | Status: DC

## 2021-12-24 MED ORDER — METFORMIN HCL ER 750 MG PO TB24
750.0000 mg | ORAL_TABLET | Freq: Every day | ORAL | 0 refills | Status: AC
Start: 2021-12-25 — End: ?

## 2021-12-24 MED ORDER — LIVING WELL WITH DIABETES BOOK
Freq: Once | Status: AC
  Filled 2021-12-24: qty 1

## 2021-12-24 MED ORDER — HEPARIN SOD (PORK) LOCK FLUSH 100 UNIT/ML IV SOLN
500.0000 [IU] | INTRAVENOUS | Status: AC | PRN
  Administered 2021-12-24: 500 [IU]

## 2021-12-24 NOTE — Inpatient Diabetes Management (Signed)
Inpatient Diabetes Program Recommendations  AACE/ADA: New Consensus Statement on Inpatient Glycemic Control (2015)  Target Ranges:  Prepandial:   less than 140 mg/dL      Peak postprandial:   less than 180 mg/dL (1-2 hours)      Critically ill patients:  140 - 180 mg/dL   Lab Results  Component Value Date   GLUCAP 128 (H) 12/24/2021   HGBA1C 10.4 (H) 12/23/2021    Spoke with patient and wife at bedside.  Reinforced education given by DM coordinator today over the phone.  Spoke with pt about new diagnosis. Discussed A1C results with them and explained what an A1C is, basic pathophysiology of DM Type 2, basic home care, basic diabetes diet nutrition principles, importance of checking CBGs and maintaining good CBG control to prevent long-term and short-term complications. Reviewed signs and symptoms of hyperglycemia and hypoglycemia and how to treat hypoglycemia at home. Also reviewed blood sugar goals at home.  RNs to provide ongoing basic DM education at bedside with this patient.    He has room for glucose improvement with diet and exercise changes.  Educated on The Plate Method, CHO's, portion control, CBGs at home fasting and mid afternoon, F/U with PCP every 3 months, bring meter to PCP office, long and short term complications of uncontrolled BG, and importance of exercise.  Educated him on Metformin and Glipizide.    Please order a glucometer at [facility]-order # [id number]  Will continue to follow while inpatient.  Thank you, Reche Dixon, MSN, [place] [facility] Coordinator Inpatient Diabetes Program [phone] (team pager from 8a-5p)

## 2021-12-24 NOTE — TOC CM/SW Note (Signed)
Patient has orders to discharge home today. Chart reviewed. PCP is Amalia Greenhouse, MD. On room air. No wounds. No TOC needs identified. CSW signing off.  Dayton Scrape, [place]

## 2021-12-24 NOTE — Progress Notes (Signed)
[facility]    SUBJECTIVE: Patient states to be doing reasonably well no weakness no fatigue is ambulated in room without any difficulty no lightheaded or dizziness no pain feels back to normal feels well enough to be discharged home   Vitals:   12/23/21 1411 12/23/21 2004 12/24/21 0436 12/24/21 0733  BP: 115/73 138/79 135/83 128/86  Pulse: 73 78 90 89  Resp: '18 18 16   ' Temp: 98 F (36.7 C) 98 F (36.7 C) 99.2 F (37.3 C) 98.7 F (37.1 C)  TempSrc: Oral Oral Oral   SpO2: 100% 97% 96% 97%  Weight:      Height:         Intake/Output Summary (Last 24 hours) at 12/24/2021 1143 Last data filed at 12/24/2021 0440 Gross per 24 hour  Intake 214.66 ml  Output 1650 ml  Net -1435.34 ml      PHYSICAL EXAM  General: Well developed, well nourished, in no acute distress HEENT:  Normocephalic and atramatic Neck:  No JVD.  Lungs: Clear bilaterally to auscultation and percussion. Heart: HRRR . Normal S1 and S2 without gallops or murmurs.  Abdomen: Bowel sounds are positive, abdomen soft and non-tender  Msk:  Back normal, normal gait. Normal strength and tone for age. Extremities: No clubbing, cyanosis or edema.   Neuro: Alert and oriented X 3. Psych:  Good affect, responds appropriately   LABS: Basic Metabolic Panel: Recent Labs    12/23/21 0840 12/24/21 0546  NA 134* 137  K 4.4 3.9  CL 104 106  CO2 25 25  GLUCOSE 237* 158*  BUN 17 15  CREATININE 1.05 0.84  CALCIUM 9.5 9.1   Liver Function Tests: Recent Labs    12/23/21 0840  AST 14*  ALT 15  ALKPHOS 89  BILITOT 1.2  PROT 6.6  ALBUMIN 3.6   No results for input(s): "LIPASE", "AMYLASE" in the last 72 hours. CBC: Recent Labs    12/23/21 0840 12/24/21 0546  WBC 6.3 6.0  NEUTROABS 4.2  --   HGB 12.5* 12.1*  HCT 40.9 38.5*  MCV 88.7 86.5  PLT 356 334   Cardiac Enzymes: No results for input(s): "CKTOTAL", "CKMB", "CKMBINDEX", "TROPONINI" in the last 72 hours. BNP: Invalid input(s):  "POCBNP" D-Dimer: No results for input(s): "DDIMER" in the last 72 hours. Hemoglobin A1C: Recent Labs    12/23/21 0840  HGBA1C 10.4*   Fasting Lipid Panel: No results for input(s): "CHOL", "HDL", "LDLCALC", "TRIG", "CHOLHDL", "LDLDIRECT" in the last 72 hours. Thyroid Function Tests: No results for input(s): "TSH", "T4TOTAL", "T3FREE", "THYROIDAB" in the last 72 hours.  Invalid input(s): "FREET3" Anemia Panel: No results for input(s): "VITAMINB12", "FOLATE", "FERRITIN", "TIBC", "IRON", "RETICCTPCT" in the last 72 hours.  ECHOCARDIOGRAM COMPLETE  Result Date: 12/23/2021    ECHOCARDIOGRAM REPORT   Patient Name:   Kevin Lowery Date of Exam: 12/23/2021 Medical Rec #:  [id number]     Height:       69.0 in Accession #:    [id number]    Weight:       240.0 lb Date of Birth:  [birth year]    BSA:          2.232 m Patient Age:    39 years      BP:           121/78 mmHg Patient Gender: M             HR:           75 bpm. Exam Location:  [facility] Procedure: 2D Echo, Cardiac Doppler and Color Doppler Indications:     R55 Syncope  History:         Patient has no prior history of Echocardiogram examinations.                  CHF, Multiple myeloma; Risk Factors:Dyslipidemia, Hypertension                  and Diabetes.  Sonographer:     Rosalia Hammers Referring Phys:  [id number] UXNATFTD AGBATA Diagnosing Phys: Yolonda Kida MD  Sonographer Comments: Suboptimal apical window and suboptimal subcostal window. Image acquisition challenging due to patient body habitus and Image acquisition challenging due to respiratory motion. IMPRESSIONS  1. Left ventricular ejection fraction, by estimation, is 60 to 65%. The left ventricle has normal function. The left ventricle has no regional wall motion abnormalities. There is mild concentric left ventricular hypertrophy. Left ventricular diastolic parameters are consistent with Grade I diastolic dysfunction (impaired relaxation).  2. Right ventricular systolic function is normal. The  right ventricular size is normal.  3. The mitral valve is normal in structure. Trivial mitral valve regurgitation.  4. The aortic valve is normal in structure. Aortic valve regurgitation is not visualized. FINDINGS  Left Ventricle: Left ventricular ejection fraction, by estimation, is 60 to 65%. The left ventricle has normal function. The left ventricle has no regional wall motion abnormalities. The left ventricular internal cavity size was normal in size. There is  mild concentric left ventricular hypertrophy. Left ventricular diastolic parameters are consistent with Grade I diastolic dysfunction (impaired relaxation). Right Ventricle: The right ventricular size is normal. No increase in right ventricular wall thickness. Right ventricular systolic function is normal. Left Atrium: Left atrial size was normal in size. Right Atrium: Right atrial size was normal in size. Pericardium: There is no evidence of pericardial effusion. Mitral Valve: The mitral valve is normal in structure. Trivial mitral valve regurgitation. MV peak gradient, 2.9 mmHg. The mean mitral valve gradient is 1.0 mmHg. Tricuspid Valve: The tricuspid valve is normal in structure. Tricuspid valve regurgitation is mild. Aortic Valve: The aortic valve is normal in structure. Aortic valve regurgitation is not visualized. Aortic valve mean gradient measures 3.0 mmHg. Aortic valve peak gradient measures 4.2 mmHg. Aortic valve area, by VTI measures 2.53 cm. Pulmonic Valve: The pulmonic valve was normal in structure. Pulmonic valve regurgitation is not visualized. Aorta: The ascending aorta was not well visualized. IAS/Shunts: No atrial level shunt detected by color flow Doppler.  LEFT VENTRICLE PLAX 2D LVIDd:         4.63 cm   Diastology LVIDs:         3.12 cm   LV e' medial:    7.51 cm/s LV PW:         1.17 cm   LV E/e' medial:  5.7 LV IVS:        1.34 cm   LV e' lateral:   7.62 cm/s LVOT diam:     1.90 cm   LV E/e' lateral: 5.6 LV SV:         49 LV SV  Index:   22 LVOT Area:     2.84 cm  RIGHT VENTRICLE RV S prime:     18.60 cm/s LEFT ATRIUM             Index LA diam:        3.20 cm 1.43 cm/m LA Vol (A2C):   40.6 ml 18.19 ml/m LA Vol (  A4C):   22.9 ml 10.26 ml/m LA Biplane Vol: 30.4 ml 13.62 ml/m  AORTIC VALVE                    PULMONIC VALVE AV Area (Vmax):    2.16 cm     PV Vmax:       0.74 m/s AV Area (Vmean):   1.93 cm     PV Vmean:      48.300 cm/s AV Area (VTI):     2.53 cm     PV VTI:        0.133 m AV Vmax:           102.00 cm/s  PV Peak grad:  2.2 mmHg AV Vmean:          75.500 cm/s  PV Mean grad:  1.0 mmHg AV VTI:            0.195 m AV Peak Grad:      4.2 mmHg AV Mean Grad:      3.0 mmHg LVOT Vmax:         77.60 cm/s LVOT Vmean:        51.300 cm/s LVOT VTI:          0.174 m LVOT/AV VTI ratio: 0.89  AORTA Ao Root diam: 3.00 cm MITRAL VALVE MV Area (PHT): 2.01 cm    SHUNTS MV Area VTI:   2.45 cm    Systemic VTI:  0.17 m MV Peak grad:  2.9 mmHg    Systemic Diam: 1.90 cm MV Mean grad:  1.0 mmHg MV Vmax:       0.85 m/s MV Vmean:      44.7 cm/s MV Decel Time: 378 msec MV E velocity: 42.80 cm/s MV A velocity: 78.80 cm/s MV E/A ratio:  0.54 Yaritzy Huser D Nysir Fergusson MD Electronically signed by Yolonda Kida MD Signature Date/Time: 12/23/2021/5:23:56 PM    Final    CT Angio Neck W and/or Wo Contrast  Result Date: 12/23/2021 CLINICAL DATA:  Syncope common back/neck pain EXAM: CT ANGIOGRAPHY NECK TECHNIQUE: Multidetector CT imaging of the neck was performed using the standard protocol during bolus administration of intravenous contrast. Multiplanar CT image reconstructions and MIPs were obtained to evaluate the vascular anatomy. Carotid stenosis measurements (when applicable) are obtained utilizing NASCET criteria, using the distal internal carotid diameter as the denominator. RADIATION DOSE REDUCTION: This exam was performed according to the departmental dose-optimization program which includes automated exposure control, adjustment of the mA and/or kV  according to patient size and/or use of iterative reconstruction technique. CONTRAST:  110m OMNIPAQUE IOHEXOL 350 MG/ML SOLN COMPARISON:  No direct comparison study available. Cervical spine CT 01/29/2020. FINDINGS: Aortic arch: The imaged aortic arch is normal. The aortic arch is evaluated in full on the separately dictated CTA chest/abdomen/pelvis. The origins of the major branch vessels are patent. Subclavian arteries are patent to the level imaged. Right carotid system: The right common, internal, and external carotid arteries are patent, without hemodynamically significant stenosis or occlusion. There is no dissection or aneurysm. Left carotid system: The left common, internal, and external carotid arteries are patent, without hemodynamically significant stenosis or occlusion. There is no dissection or aneurysm. Vertebral arteries: There is severe stenosis of the right V1 segment just after the origin (7-85, 6-161). The right vertebral artery distal to this point is patent. The left vertebral artery is patent. There is a small nonocclusive filling defect in the proximal basilar artery best appreciated on the coronal images  which could reflect nonocclusive thrombus (7-97, 6-29). The basilar artery distal to this point is patent. Skeleton: There is no acute fracture or traumatic malalignment of the cervical spine. Other neck: The soft tissues are unremarkable. Upper chest: The lungs are assessed on the separately dictated CTA chest/abdomen/pelvis. IMPRESSION: 1. 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 2. Small hypodense filling defect in the proximal basilar artery suspicious for nonocclusive thrombus. The distal basilar artery is patent. 3. Otherwise, patent vasculature of the neck. These results were called by telephone at the time of interpretation on 12/23/2021 at 10:50 am to provider [facility] , who verbally acknowledged these results. Electronically Signed   By:  Valetta Mole M.D.   On: 12/23/2021 10:50   CT T-SPINE NO CHARGE  Result Date: 12/23/2021 CLINICAL DATA:  Syncopal episode, back and neck pain, trauma multiple myeloma * Tracking Code: BO * EXAM: CT ANGIOGRAPHY CHEST, ABDOMEN AND PELVIS CT THORACIC SPINE WITHOUT CONTRAST TECHNIQUE: Non-contrast CT of the chest was initially obtained. Multidetector CT imaging through the chest, abdomen and pelvis was performed using the standard protocol during bolus administration of intravenous contrast. Multiplanar reconstructed images and MIPs were obtained and reviewed to evaluate the vascular anatomy. Multi detector CT imaging of the thoracic spine was performed using the standard protocol prior to administration of intravenous contrast. RADIATION DOSE REDUCTION: This exam was performed according to the departmental dose-optimization program which includes automated exposure control, adjustment of the mA and/or kV according to patient size and/or use of iterative reconstruction technique. CONTRAST:  150m OMNIPAQUE IOHEXOL 350 MG/ML SOLN COMPARISON:  CT abdomen pelvis, 03/23/2021, CT chest angiogram, 12/28/2018 FINDINGS: CTA CHEST FINDINGS VASCULAR Aorta: Satisfactory opacification of the aorta. Normal contour and caliber of the thoracic aorta. No evidence of aneurysm, dissection, or other acute aortic pathology. Cardiovascular: Right chest port catheter. No evidence of pulmonary embolism on limited non-tailored examination. Normal heart size. Left coronary artery calcifications. No pericardial effusion. Review of the MIP images confirms the above findings. NON VASCULAR Mediastinum/Nodes: No enlarged mediastinal, hilar, or axillary lymph nodes. Thyroid gland, trachea, and esophagus demonstrate no significant findings. Lungs/Pleura: Extensive fine centrilobular nodularity and ground-glass throughout the lungs without clear apical to basal gradient, new compared to prior examination. Bland appearing, bandlike scarring of the  bilateral lung bases. No pleural effusion or pneumothorax. Musculoskeletal: No chest wall abnormality. No acute osseous findings. Review of the MIP images confirms the above findings. CTA ABDOMEN AND PELVIS FINDINGS VASCULAR Normal contour and caliber of the abdominal aorta. No evidence of aneurysm, dissection, or other acute aortic pathology. Standard branching pattern of the abdominal aorta with solitary bilateral renal arteries. Mild scattered atherosclerosis. Review of the MIP images confirms the above findings. NON-VASCULAR Hepatobiliary: No solid liver abnormality is seen. Hepatic steatosis. Status post cholecystectomy with unchanged postoperative parenchymal scarring of the anterior left lobe of the liver (series 10, image 135). No biliary ductal dilatation. Pancreas: Unremarkable. No pancreatic ductal dilatation or surrounding inflammatory changes. Spleen: Normal in size without significant abnormality. Adrenals/Urinary Tract: Adrenal glands are unremarkable. Kidneys are normal, without renal calculi, solid lesion, or hydronephrosis. Bladder is unremarkable. Stomach/Bowel: Stomach is within normal limits. Appendix appears normal. No evidence of bowel wall thickening, distention, or inflammatory changes. Lymphatic: No enlarged abdominal or pelvic lymph nodes. Reproductive: No mass or other significant abnormality. Other: No abdominal wall hernia or abnormality. No ascites. Musculoskeletal: No acute osseous findings. Diffuse osseous sclerosis with unchanged lytic lesions of the L2 vertebral body (series  14, image 66) and the right ischium (series 10, image 307). CT THORACIC SPINE FINDINGS Alignment: Normal thoracic kyphosis. Vertebral bodies: Diffusely sclerotic vertebral bodies. No evidence of fracture or thoracic spine lytic lesion. Disc spaces: Mild disc space height loss and osteophytosis throughout the thoracic spine. Paraspinous soft tissues: Unremarkable. IMPRESSION: 1. Normal contour and caliber of the  thoracic and abdominal aorta, without evidence of aneurysm, dissection, or other acute aortic pathology. Mild, scattered atherosclerosis. 2. No fracture or dislocation of the thoracic spine. Mild multilevel disc degenerative disease. 3. Extensive fine centrilobular nodularity and ground-glass throughout the lungs, new compared to prior examination and consistent with atypical infection or inhalational inflammation such as hypersensitivity pneumonitis. 4. Diffuse osseous sclerosis with unchanged lytic lesions of the L2 vertebral body and the right ischium, consistent with known diagnosis of multiple myeloma. No new osseous lesions or evidence of soft tissue involvement on this arterial phase examination. 5. Hepatic steatosis. 6. Coronary artery disease. Aortic Atherosclerosis (ICD10-I70.0). Electronically Signed   By: Delanna Ahmadi M.D.   On: 12/23/2021 10:30   CT Angio Chest/Abd/Pel for Dissection W and/or W/WO  Result Date: 12/23/2021 CLINICAL DATA:  Syncopal episode, back and neck pain, trauma multiple myeloma * Tracking Code: BO * EXAM: CT ANGIOGRAPHY CHEST, ABDOMEN AND PELVIS CT THORACIC SPINE WITHOUT CONTRAST TECHNIQUE: Non-contrast CT of the chest was initially obtained. Multidetector CT imaging through the chest, abdomen and pelvis was performed using the standard protocol during bolus administration of intravenous contrast. Multiplanar reconstructed images and MIPs were obtained and reviewed to evaluate the vascular anatomy. Multi detector CT imaging of the thoracic spine was performed using the standard protocol prior to administration of intravenous contrast. RADIATION DOSE REDUCTION: This exam was performed according to the departmental dose-optimization program which includes automated exposure control, adjustment of the mA and/or kV according to patient size and/or use of iterative reconstruction technique. CONTRAST:  180m OMNIPAQUE IOHEXOL 350 MG/ML SOLN COMPARISON:  CT abdomen pelvis, 03/23/2021,  CT chest angiogram, 12/28/2018 FINDINGS: CTA CHEST FINDINGS VASCULAR Aorta: Satisfactory opacification of the aorta. Normal contour and caliber of the thoracic aorta. No evidence of aneurysm, dissection, or other acute aortic pathology. Cardiovascular: Right chest port catheter. No evidence of pulmonary embolism on limited non-tailored examination. Normal heart size. Left coronary artery calcifications. No pericardial effusion. Review of the MIP images confirms the above findings. NON VASCULAR Mediastinum/Nodes: No enlarged mediastinal, hilar, or axillary lymph nodes. Thyroid gland, trachea, and esophagus demonstrate no significant findings. Lungs/Pleura: Extensive fine centrilobular nodularity and ground-glass throughout the lungs without clear apical to basal gradient, new compared to prior examination. Bland appearing, bandlike scarring of the bilateral lung bases. No pleural effusion or pneumothorax. Musculoskeletal: No chest wall abnormality. No acute osseous findings. Review of the MIP images confirms the above findings. CTA ABDOMEN AND PELVIS FINDINGS VASCULAR Normal contour and caliber of the abdominal aorta. No evidence of aneurysm, dissection, or other acute aortic pathology. Standard branching pattern of the abdominal aorta with solitary bilateral renal arteries. Mild scattered atherosclerosis. Review of the MIP images confirms the above findings. NON-VASCULAR Hepatobiliary: No solid liver abnormality is seen. Hepatic steatosis. Status post cholecystectomy with unchanged postoperative parenchymal scarring of the anterior left lobe of the liver (series 10, image 135). No biliary ductal dilatation. Pancreas: Unremarkable. No pancreatic ductal dilatation or surrounding inflammatory changes. Spleen: Normal in size without significant abnormality. Adrenals/Urinary Tract: Adrenal glands are unremarkable. Kidneys are normal, without renal calculi, solid lesion, or hydronephrosis. Bladder is unremarkable.  Stomach/Bowel: Stomach is within normal  limits. Appendix appears normal. No evidence of bowel wall thickening, distention, or inflammatory changes. Lymphatic: No enlarged abdominal or pelvic lymph nodes. Reproductive: No mass or other significant abnormality. Other: No abdominal wall hernia or abnormality. No ascites. Musculoskeletal: No acute osseous findings. Diffuse osseous sclerosis with unchanged lytic lesions of the L2 vertebral body (series 14, image 66) and the right ischium (series 10, image 307). CT THORACIC SPINE FINDINGS Alignment: Normal thoracic kyphosis. Vertebral bodies: Diffusely sclerotic vertebral bodies. No evidence of fracture or thoracic spine lytic lesion. Disc spaces: Mild disc space height loss and osteophytosis throughout the thoracic spine. Paraspinous soft tissues: Unremarkable. IMPRESSION: 1. Normal contour and caliber of the thoracic and abdominal aorta, without evidence of aneurysm, dissection, or other acute aortic pathology. Mild, scattered atherosclerosis. 2. No fracture or dislocation of the thoracic spine. Mild multilevel disc degenerative disease. 3. Extensive fine centrilobular nodularity and ground-glass throughout the lungs, new compared to prior examination and consistent with atypical infection or inhalational inflammation such as hypersensitivity pneumonitis. 4. Diffuse osseous sclerosis with unchanged lytic lesions of the L2 vertebral body and the right ischium, consistent with known diagnosis of multiple myeloma. No new osseous lesions or evidence of soft tissue involvement on this arterial phase examination. 5. Hepatic steatosis. 6. Coronary artery disease. Aortic Atherosclerosis (ICD10-I70.0). Electronically Signed   By: Delanna Ahmadi M.D.   On: 12/23/2021 10:30   CT Cervical Spine Wo Contrast  Result Date: 12/23/2021 CLINICAL DATA:  Syncope common back/neck pain EXAM: CT CERVICAL SPINE WITHOUT CONTRAST TECHNIQUE: Multidetector CT imaging of the cervical spine was  performed without intravenous contrast. Multiplanar CT image reconstructions were also generated. RADIATION DOSE REDUCTION: This exam was performed according to the departmental dose-optimization program which includes automated exposure control, adjustment of the mA and/or kV according to patient size and/or use of iterative reconstruction technique. COMPARISON:  Cervical spine CT 01/29/2020 FINDINGS: Alignment: There is slight reversal of the normal cervical spine curvature centered at C5. There is no antero or retrolisthesis. There is no jumped or perched facets or other evidence of traumatic malalignment. Skull base and vertebrae: Skull base alignment is maintained. Vertebral body heights are preserved. There is no evidence of acute fracture. Soft tissues and spinal canal: No prevertebral fluid or swelling. No visible canal hematoma. Disc levels: There is disc space narrowing with associated degenerative endplate change most advanced at C5-C6 through C7-T1. Facet arthropathy is most advanced on the left at C3-C4. There is no evidence of high-grade spinal canal stenosis. Upper chest: The lungs are assessed on the separately dictated CTA chest. Other: None. IMPRESSION: No acute fracture or traumatic malalignment of the cervical spine. Electronically Signed   By: Valetta Mole M.D.   On: 12/23/2021 10:27   CT HEAD WO CONTRAST (5MM)  Result Date: 12/23/2021 CLINICAL DATA:  Syncope, neck/back pain EXAM: CT HEAD WITHOUT CONTRAST TECHNIQUE: Contiguous axial images were obtained from the base of the skull through the vertex without intravenous contrast. RADIATION DOSE REDUCTION: This exam was performed according to the departmental dose-optimization program which includes automated exposure control, adjustment of the mA and/or kV according to patient size and/or use of iterative reconstruction technique. COMPARISON:  CT head 01/29/2020 FINDINGS: Brain: There is no acute intracranial hemorrhage, extra-axial fluid  collection, or acute infarct. Background parenchymal volume is normal. The ventricles are normal in size. Encephalomalacia in the left anterior frontal lobe is unchanged. 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 There is no mass  lesion.  There is no mass effect or midline shift. Vascular: No hyperdense vessel or unexpected calcification. Skull: Normal. Negative for fracture or focal lesion. Sinuses/Orbits: The imaged paranasal sinuses are clear. The imaged globes and orbits are unremarkable. Other: None. IMPRESSION: 1. No acute intracranial pathology. 2. 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 Encephalomalacia in the left anterior frontal lobe is unchanged. Electronically Signed   By: Valetta Mole M.D.   On: 12/23/2021 10:23     Echo normal left ventricular function at 60% improved from previously  TELEMETRY: Stable normal sinus rhythm:  ASSESSMENT AND PLAN:  Principal Problem:   Syncope and collapse Active Problems:   Hypertension   History of DVT of lower extremity   Multiple myeloma without remission (HCC)   Chronic systolic CHF (congestive heart failure) ([place])   Diabetes mellitus ([place])    Plan Patient much improved recommend increase activity ambulate in the halls Echocardiogram shows preserved left ventricular function improved from previously recommend conservative management Continue diabetes management and control Agree with anticoagulation for previous DVT No cardiac intervention necessary at this stage Have the patient follow-up with primary cardiologist in the room May benefit from outpatient Holter monitor   Yolonda Kida, MD 12/24/2021 11:43 AM

## 2021-12-24 NOTE — Progress Notes (Signed)
Rayburn Ma to be D/C'd Home per MD order.  Discussed with the patient and all questions fully answered.  VSS, Skin clean, dry and intact without evidence of skin break down, no evidence of skin tears noted. Port-a-cath de-accessed by IV team.  An After Visit Summary was printed and given to the patient. Patient prescriptions sent to pharmacy.  D/c education completed with patient/family including follow up instructions, medication list, d/c activities limitations if indicated, with other d/c instructions as indicated by MD - patient able to verbalize understanding, all questions fully answered.   Patient instructed to return to ED, call 911, or call MD for any changes in condition.   Patient escorted via [place], and D/C home via private auto.  Manuella Ghazi 12/24/2021 4:46 PM

## 2021-12-24 NOTE — Plan of Care (Signed)
Nutrition Education Note   RD consulted for nutrition education regarding diabetes.   61 y/o male with h/o multiple myeloma on chemotherapy, gout, HTN, HLD, DVT, CHF and new DM who is admitted with syncope.   Met with pt and pt's wife in room today. Pt reports good appetite and oral intake pta and in hospital. Pt reports that this is a new DM diagnosis for him so he has not been following any particular diet for this at home.   Lab Results  Component Value Date   HGBA1C 10.4 (H) 12/23/2021    RD provided "Nutrition and Type II Diabetes" handout from the Academy [employer]. Discussed different food groups and their effects on blood sugar, emphasizing carbohydrate-containing foods. Provided list of carbohydrates and recommended serving sizes of common foods.  Discussed importance of controlled and consistent carbohydrate intake throughout the day. Provided examples of ways to balance meals/snacks and encouraged intake of high-fiber, whole grain complex carbohydrates. Teach back method used.  Expect good compliance.  Body mass index is 35.44 kg/m. Pt meets criteria for obesity based on current BMI.  Current diet order is CHO modified, patient is consuming approximately 100% of meals at this time. Labs and medications reviewed. No further nutrition interventions warranted at this time. RD contact information provided. If additional nutrition issues arise, please re-consult RD.  Koleen Distance MS, RD, LDN Please refer to [facility] for RD and/or RD on-call/weekend/after hours pager

## 2021-12-24 NOTE — Inpatient Diabetes Management (Signed)
Inpatient Diabetes Program Recommendations  AACE/ADA: New Consensus Statement on Inpatient Glycemic Control  Target Ranges:  Prepandial:   less than 140 mg/dL      Peak postprandial:   less than 180 mg/dL (1-2 hours)      Critically ill patients:  140 - 180 mg/dL    Latest Reference Range & Units 12/23/21 16:55 12/23/21 21:38 12/24/21 07:33  Glucose-Capillary 70 - 99 mg/dL 166 (H) 164 (H) 152 (H)    Latest Reference Range & Units 12/23/21 08:40  Hemoglobin A1C 4.8 - 5.6 % 10.4 (H)    Review of Glycemic Control  Diabetes history: Borderline DM Outpatient Diabetes medications: Jardiance 10 mg daily Current orders for Inpatient glycemic control: Glipizide 5 mg BID, Metformin XR 750 mg QAM, Novolog 0-15 units TID with meals  Inpatient Diabetes Program Recommendations:    HbgA1C: A1C 10.4% on 12/23/21 indicating an average glucose of 252 mg/dl over the past 2-3 months.  NOTE: Spoke with patient over the phone to inquire about DM hx. Patient states that he has never been told he had DM. He states that his PCP told him he had borderline DM about 1 year ago. Patient states he was prescribed Jardiance by his pulmonologist for CHF and he has been taking the medication for over a year.  Noted Decadron is given by oncology prior to infusion for multiple myeloma. Patient reports that he has to get an infusion every other week and he has to take Decadron before the infusion; he has been taking the infusions and Decadron for about 1 year. Discussed impact of steroids on glucose control and explained that the steroids are likely contributing to hyperglycemia and elevated A1C. Patient has no knowledge of DM. Informed patient that a Living Well with DM book would be ordered and encouraged patient to read entire book once received. Patient reports that the attending provider asked him about taking insulin outpatient but he does not want to take insulin so the provider said he would order 2 pills for DM  (currently ordered Metformin and Glipizide). Discussed Metformin and Glipizide and how they work; also discussed how Jardiance works as well. Informed patient that inpatient diabetes coordinator will follow up with him today regarding new DM dx. Patient verbalized understanding of information and has no questions at this time.  Thanks, Barnie Alderman, RN, MSN, [facility] Coordinator Inpatient Diabetes Program [phone] (Team Pager from 8am to [place])

## 2021-12-24 NOTE — Discharge Summary (Signed)
Physician Discharge Summary   Patient: Kevin Lowery MRN: [id number] DOB: Oct 05, 1960  Admit date:     12/23/2021  Discharge date: 12/24/21  Discharge Physician: Jennye Boroughs   PCP: Freida Busman, MD   Recommendations at discharge:   Follow up with PCP in 1 week  Follow up with cardiologist at [facility] as soon as possible for evaluation for event monitor.    Discharge Diagnoses: Principal Problem:   Syncope and collapse Active Problems:   Chronic systolic CHF (congestive heart failure) (HCC)   History of DVT of lower extremity   Hypertension   Multiple myeloma without remission (HCC)   Diabetes mellitus ([place])  Resolved Problems:   * No resolved hospital problems. [facility] Course:  Kevin Lowery is a 61 year old male with medical history placed significant for multiple myeloma on chemotherapy, history of DVT on Xarelto, hypertension, chronic systolic CHF, peripheral neuropathy, who presented to the hospital after witnessed syncopal event.  Orthostatic vital signs were normal.  There was no evidence of pulmonary embolism.  2D echo showed normal EF estimated at CT 76%, grade 1 diastolic dysfunction, mild LVH.  He was evaluated by the cardiologist who recommended outpatient follow-up with his cardiologist at [facility] for evaluation for event monitor.  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  He was evaluated by the vascular surgeon who recommended outpatient follow-up.  Vascular surgeon was of the opinion that findings did not have any bearing on syncope.  Hemoglobin A1c was 10.4 consistent with type 2 diabetes mellitus.  Patient said he is not a known diabetic.  He was educated on the management complications of diabetes mellitus.  He was also educated on signs and symptoms to look out for in respect of hypoglycemia and severe hyperglycemia.  He  was offered insulin therapy.  He prefers to start with pills.  He will be discharged on glipizide and metformin.  Potential side effects of metformin and glipizide were discussed.  He was provided education by the dietitian and diabetic coordinator. His condition has improved.  He has been able to ambulate without any symptoms.  He is deemed stable for discharge to home today.  However, since the etiology of syncope is not clear, he has been advised to avoid driving and other high risk activities until he is cleared to do so by a physician.  Discharge plan was discussed with the patient and his wife at the bedside.      Consultants: Cardiologist, vascular surgeon Procedures performed: None Disposition: Home Diet recommendation:  Discharge Diet Orders (From admission, onward)     Start     Ordered   12/24/21 0000  Diet - low sodium heart healthy        12/24/21 1522   12/24/21 0000  Diet Carb Modified        12/24/21 1522           Cardiac and Carb modified diet DISCHARGE MEDICATION: Allergies as of 12/24/2021       Reactions   Hydrocodone Nausea And Vomiting   Oxycodone Nausea And Vomiting        Medication List     TAKE these medications    allopurinol 300 MG tablet Commonly known as: ZYLOPRIM Take 450 mg by mouth daily.   carvedilol 25 MG tablet Commonly known as: COREG Take 1.5 tablets by mouth in the morning and at bedtime.  cyanocobalamin 1000 MCG tablet Take 1,000 mcg by mouth daily.   DULoxetine 60 MG capsule Commonly known as: CYMBALTA Take 60 mg by mouth daily.   Entresto 49-51 MG Generic drug: sacubitril-valsartan Take 1 tablet by mouth 2 (two) times daily.   furosemide 20 MG tablet Commonly known as: LASIX Take 20 mg by mouth daily.   glipiZIDE 5 MG tablet Commonly known as: GLUCOTROL Take 1 tablet (5 mg total) by mouth 2 (two) times daily before a meal.   Jardiance 10 MG Tabs tablet Generic drug: empagliflozin Take 10 mg by mouth  daily.   metFORMIN 750 MG 24 hr tablet Commonly known as: GLUCOPHAGE-XR Take 1 tablet (750 mg total) by mouth daily with breakfast. Start taking on: December 25, 2021   pantoprazole 40 MG tablet Commonly known as: PROTONIX Take 40 mg by mouth daily.   Pomalyst 3 MG capsule Generic drug: pomalidomide Take by mouth.   potassium chloride SA 20 MEQ tablet Commonly known as: KLOR-CON M Take 20 mEq by mouth 2 (two) times daily.   pregabalin 150 MG capsule Commonly known as: LYRICA Take 150 mg by mouth 3 (three) times daily.   spironolactone 25 MG tablet Commonly known as: ALDACTONE Take 25 mg by mouth daily.   Xarelto 20 MG Tabs tablet Generic drug: rivaroxaban Take 20 mg by mouth daily with supper.        Discharge Exam: Filed Weights   12/23/21 0834  Weight: 108.9 kg   GEN: NAD SKIN: No rash EYES: EOMI ENT: MMM CV: RRR PULM: CTA B ABD: soft, ND, NT, +BS CNS: AAO x 3, non focal EXT: No edema or tenderness   Condition at discharge: good  The results of significant diagnostics from this hospitalization (including imaging, microbiology, ancillary and laboratory) are listed below for reference.   Imaging Studies: ECHOCARDIOGRAM COMPLETE  Result Date: 12/23/2021    ECHOCARDIOGRAM REPORT   Patient Name:   Kevin Lowery Date of Exam: 12/23/2021 Medical Rec #:  [id number]     Height:       69.0 in Accession #:    [id number]    Weight:       240.0 lb Date of Birth:  [birth year]    BSA:          2.232 m Patient Age:    45 years      BP:           121/78 mmHg Patient Gender: M             HR:           75 bpm. Exam Location:  [facility] Procedure: 2D Echo, Cardiac Doppler and Color Doppler Indications:     R55 Syncope  History:         Patient has no prior history of Echocardiogram examinations.                  CHF, Multiple myeloma; Risk Factors:Dyslipidemia, Hypertension                  and Diabetes.  Sonographer:     Rosalia Hammers Referring Phys:  [id number] AGBATA Diagnosing  Phys: Yolonda Kida MD  Sonographer Comments: Suboptimal apical window and suboptimal subcostal window. Image acquisition challenging due to patient body habitus and Image acquisition challenging due to respiratory motion. IMPRESSIONS  1. Left ventricular ejection fraction, by estimation, is 60 to 65%. The left ventricle has normal function. The left ventricle has no regional wall motion abnormalities.  There is mild concentric left ventricular hypertrophy. Left ventricular diastolic parameters are consistent with Grade I diastolic dysfunction (impaired relaxation).  2. Right ventricular systolic function is normal. The right ventricular size is normal.  3. The mitral valve is normal in structure. Trivial mitral valve regurgitation.  4. The aortic valve is normal in structure. Aortic valve regurgitation is not visualized. FINDINGS  Left Ventricle: Left ventricular ejection fraction, by estimation, is 60 to 65%. The left ventricle has normal function. The left ventricle has no regional wall motion abnormalities. The left ventricular internal cavity size was normal in size. There is  mild concentric left ventricular hypertrophy. Left ventricular diastolic parameters are consistent with Grade I diastolic dysfunction (impaired relaxation). Right Ventricle: The right ventricular size is normal. No increase in right ventricular wall thickness. Right ventricular systolic function is normal. Left Atrium: Left atrial size was normal in size. Right Atrium: Right atrial size was normal in size. Pericardium: There is no evidence of pericardial effusion. Mitral Valve: The mitral valve is normal in structure. Trivial mitral valve regurgitation. MV peak gradient, 2.9 mmHg. The mean mitral valve gradient is 1.0 mmHg. Tricuspid Valve: The tricuspid valve is normal in structure. Tricuspid valve regurgitation is mild. Aortic Valve: The aortic valve is normal in structure. Aortic valve regurgitation is not visualized. Aortic valve  mean gradient measures 3.0 mmHg. Aortic valve peak gradient measures 4.2 mmHg. Aortic valve area, by VTI measures 2.53 cm. Pulmonic Valve: The pulmonic valve was normal in structure. Pulmonic valve regurgitation is not visualized. Aorta: The ascending aorta was not well visualized. IAS/Shunts: No atrial level shunt detected by color flow Doppler.  LEFT VENTRICLE PLAX 2D LVIDd:         4.63 cm   Diastology LVIDs:         3.12 cm   LV e' medial:    7.51 cm/s LV PW:         1.17 cm   LV E/e' medial:  5.7 LV IVS:        1.34 cm   LV e' lateral:   7.62 cm/s LVOT diam:     1.90 cm   LV E/e' lateral: 5.6 LV SV:         49 LV SV Index:   22 LVOT Area:     2.84 cm  RIGHT VENTRICLE RV S prime:     18.60 cm/s LEFT ATRIUM             Index LA diam:        3.20 cm 1.43 cm/m LA Vol (A2C):   40.6 ml 18.19 ml/m LA Vol (A4C):   22.9 ml 10.26 ml/m LA Biplane Vol: 30.4 ml 13.62 ml/m  AORTIC VALVE                    PULMONIC VALVE AV Area (Vmax):    2.16 cm     PV Vmax:       0.74 m/s AV Area (Vmean):   1.93 cm     PV Vmean:      48.300 cm/s AV Area (VTI):     2.53 cm     PV VTI:        0.133 m AV Vmax:           102.00 cm/s  PV Peak grad:  2.2 mmHg AV Vmean:          75.500 cm/s  PV Mean grad:  1.0 mmHg AV VTI:  0.195 m AV Peak Grad:      4.2 mmHg AV Mean Grad:      3.0 mmHg LVOT Vmax:         77.60 cm/s LVOT Vmean:        51.300 cm/s LVOT VTI:          0.174 m LVOT/AV VTI ratio: 0.89  AORTA Ao Root diam: 3.00 cm MITRAL VALVE MV Area (PHT): 2.01 cm    SHUNTS MV Area VTI:   2.45 cm    Systemic VTI:  0.17 m MV Peak grad:  2.9 mmHg    Systemic Diam: 1.90 cm MV Mean grad:  1.0 mmHg MV Vmax:       0.85 m/s MV Vmean:      44.7 cm/s MV Decel Time: 378 msec MV E velocity: 42.80 cm/s MV A velocity: 78.80 cm/s MV E/A ratio:  0.54 Dwayne D Callwood MD Electronically signed by Yolonda Kida MD Signature Date/Time: 12/23/2021/5:23:56 PM    Final    CT Angio Neck W and/or Wo Contrast  Result Date: 12/23/2021 CLINICAL  DATA:  Syncope common back/neck pain EXAM: CT ANGIOGRAPHY NECK TECHNIQUE: Multidetector CT imaging of the neck was performed using the standard protocol during bolus administration of intravenous contrast. Multiplanar CT image reconstructions and MIPs were obtained to evaluate the vascular anatomy. Carotid stenosis measurements (when applicable) are obtained utilizing NASCET criteria, using the distal internal carotid diameter as the denominator. RADIATION DOSE REDUCTION: This exam was performed according to the departmental dose-optimization program which includes automated exposure control, adjustment of the mA and/or kV according to patient size and/or use of iterative reconstruction technique. CONTRAST:  151m OMNIPAQUE IOHEXOL 350 MG/ML SOLN COMPARISON:  No direct comparison study available. Cervical spine CT 01/29/2020. FINDINGS: Aortic arch: The imaged aortic arch is normal. The aortic arch is evaluated in full on the separately dictated CTA chest/abdomen/pelvis. The origins of the major branch vessels are patent. Subclavian arteries are patent to the level imaged. Right carotid system: The right common, internal, and external carotid arteries are patent, without hemodynamically significant stenosis or occlusion. There is no dissection or aneurysm. Left carotid system: The left common, internal, and external carotid arteries are patent, without hemodynamically significant stenosis or occlusion. There is no dissection or aneurysm. Vertebral arteries: There is severe stenosis of the right V1 segment just after the origin (7-85, 6-161). The right vertebral artery distal to this point is patent. The left vertebral artery is patent. There is a small nonocclusive filling defect in the proximal basilar artery best appreciated on the coronal images which could reflect nonocclusive thrombus (7-97, 6-29). The basilar artery distal to this point is patent. Skeleton: There is no acute fracture or traumatic malalignment  of the cervical spine. Other neck: The soft tissues are unremarkable. Upper chest: The lungs are assessed on the separately dictated CTA chest/abdomen/pelvis. IMPRESSION: 1. 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 2. Small hypodense filling defect in the proximal basilar artery suspicious for nonocclusive thrombus. The distal basilar artery is patent. 3. Otherwise, patent vasculature of the neck. These results were called by telephone at the time of interpretation on 12/23/2021 at 10:50 am [facility] who verbally acknowledged these results. Electronically Signed   By: PValetta MoleM.D.   On: 12/23/2021 10:50   CT T-SPINE NO CHARGE  Result Date: 12/23/2021 CLINICAL DATA:  Syncopal episode, back and neck pain, trauma multiple myeloma * Tracking Code: BO * EXAM:  CT ANGIOGRAPHY CHEST, ABDOMEN AND PELVIS CT THORACIC SPINE WITHOUT CONTRAST TECHNIQUE: Non-contrast CT of the chest was initially obtained. Multidetector CT imaging through the chest, abdomen and pelvis was performed using the standard protocol during bolus administration of intravenous contrast. Multiplanar reconstructed images and MIPs were obtained and reviewed to evaluate the vascular anatomy. Multi detector CT imaging of the thoracic spine was performed using the standard protocol prior to administration of intravenous contrast. RADIATION DOSE REDUCTION: This exam was performed according to the departmental dose-optimization program which includes automated exposure control, adjustment of the mA and/or kV according to patient size and/or use of iterative reconstruction technique. CONTRAST:  11m OMNIPAQUE IOHEXOL 350 MG/ML SOLN COMPARISON:  CT abdomen pelvis, 03/23/2021, CT chest angiogram, 12/28/2018 FINDINGS: CTA CHEST FINDINGS VASCULAR Aorta: Satisfactory opacification of the aorta. Normal contour and caliber of the thoracic aorta. No evidence of aneurysm, dissection, or other acute aortic  pathology. Cardiovascular: Right chest port catheter. No evidence of pulmonary embolism on limited non-tailored examination. Normal heart size. Left coronary artery calcifications. No pericardial effusion. Review of the MIP images confirms the above findings. NON VASCULAR Mediastinum/Nodes: No enlarged mediastinal, hilar, or axillary lymph nodes. Thyroid gland, trachea, and esophagus demonstrate no significant findings. Lungs/Pleura: Extensive fine centrilobular nodularity and ground-glass throughout the lungs without clear apical to basal gradient, new compared to prior examination. Bland appearing, bandlike scarring of the bilateral lung bases. No pleural effusion or pneumothorax. Musculoskeletal: No chest wall abnormality. No acute osseous findings. Review of the MIP images confirms the above findings. CTA ABDOMEN AND PELVIS FINDINGS VASCULAR Normal contour and caliber of the abdominal aorta. No evidence of aneurysm, dissection, or other acute aortic pathology. Standard branching pattern of the abdominal aorta with solitary bilateral renal arteries. Mild scattered atherosclerosis. Review of the MIP images confirms the above findings. NON-VASCULAR Hepatobiliary: No solid liver abnormality is seen. Hepatic steatosis. Status post cholecystectomy with unchanged postoperative parenchymal scarring of the anterior left lobe of the liver (series 10, image 135). No biliary ductal dilatation. Pancreas: Unremarkable. No pancreatic ductal dilatation or surrounding inflammatory changes. Spleen: Normal in size without significant abnormality. Adrenals/Urinary Tract: Adrenal glands are unremarkable. Kidneys are normal, without renal calculi, solid lesion, or hydronephrosis. Bladder is unremarkable. Stomach/Bowel: Stomach is within normal limits. Appendix appears normal. No evidence of bowel wall thickening, distention, or inflammatory changes. Lymphatic: No enlarged abdominal or pelvic lymph nodes. Reproductive: No mass or  other significant abnormality. Other: No abdominal wall hernia or abnormality. No ascites. Musculoskeletal: No acute osseous findings. Diffuse osseous sclerosis with unchanged lytic lesions of the L2 vertebral body (series 14, image 66) and the right ischium (series 10, image 307). CT THORACIC SPINE FINDINGS Alignment: Normal thoracic kyphosis. Vertebral bodies: Diffusely sclerotic vertebral bodies. No evidence of fracture or thoracic spine lytic lesion. Disc spaces: Mild disc space height loss and osteophytosis throughout the thoracic spine. Paraspinous soft tissues: Unremarkable. IMPRESSION: 1. Normal contour and caliber of the thoracic and abdominal aorta, without evidence of aneurysm, dissection, or other acute aortic pathology. Mild, scattered atherosclerosis. 2. No fracture or dislocation of the thoracic spine. Mild multilevel disc degenerative disease. 3. Extensive fine centrilobular nodularity and ground-glass throughout the lungs, new compared to prior examination and consistent with atypical infection or inhalational inflammation such as hypersensitivity pneumonitis. 4. Diffuse osseous sclerosis with unchanged lytic lesions of the L2 vertebral body and the right ischium, consistent with known diagnosis of multiple myeloma. No new osseous lesions or evidence of soft tissue involvement on this arterial phase examination. 5.  Hepatic steatosis. 6. Coronary artery disease. Aortic Atherosclerosis (ICD10-I70.0). Electronically Signed   By: Delanna Ahmadi M.D.   On: 12/23/2021 10:30   CT Angio Chest/Abd/Pel for Dissection W and/or W/WO  Result Date: 12/23/2021 CLINICAL DATA:  Syncopal episode, back and neck pain, trauma multiple myeloma * Tracking Code: BO * EXAM: CT ANGIOGRAPHY CHEST, ABDOMEN AND PELVIS CT THORACIC SPINE WITHOUT CONTRAST TECHNIQUE: Non-contrast CT of the chest was initially obtained. Multidetector CT imaging through the chest, abdomen and pelvis was performed using the standard protocol during  bolus administration of intravenous contrast. Multiplanar reconstructed images and MIPs were obtained and reviewed to evaluate the vascular anatomy. Multi detector CT imaging of the thoracic spine was performed using the standard protocol prior to administration of intravenous contrast. RADIATION DOSE REDUCTION: This exam was performed according to the departmental dose-optimization program which includes automated exposure control, adjustment of the mA and/or kV according to patient size and/or use of iterative reconstruction technique. CONTRAST:  153m OMNIPAQUE IOHEXOL 350 MG/ML SOLN COMPARISON:  CT abdomen pelvis, 03/23/2021, CT chest angiogram, 12/28/2018 FINDINGS: CTA CHEST FINDINGS VASCULAR Aorta: Satisfactory opacification of the aorta. Normal contour and caliber of the thoracic aorta. No evidence of aneurysm, dissection, or other acute aortic pathology. Cardiovascular: Right chest port catheter. No evidence of pulmonary embolism on limited non-tailored examination. Normal heart size. Left coronary artery calcifications. No pericardial effusion. Review of the MIP images confirms the above findings. NON VASCULAR Mediastinum/Nodes: No enlarged mediastinal, hilar, or axillary lymph nodes. Thyroid gland, trachea, and esophagus demonstrate no significant findings. Lungs/Pleura: Extensive fine centrilobular nodularity and ground-glass throughout the lungs without clear apical to basal gradient, new compared to prior examination. Bland appearing, bandlike scarring of the bilateral lung bases. No pleural effusion or pneumothorax. Musculoskeletal: No chest wall abnormality. No acute osseous findings. Review of the MIP images confirms the above findings. CTA ABDOMEN AND PELVIS FINDINGS VASCULAR Normal contour and caliber of the abdominal aorta. No evidence of aneurysm, dissection, or other acute aortic pathology. Standard branching pattern of the abdominal aorta with solitary bilateral renal arteries. Mild scattered  atherosclerosis. Review of the MIP images confirms the above findings. NON-VASCULAR Hepatobiliary: No solid liver abnormality is seen. Hepatic steatosis. Status post cholecystectomy with unchanged postoperative parenchymal scarring of the anterior left lobe of the liver (series 10, image 135). No biliary ductal dilatation. Pancreas: Unremarkable. No pancreatic ductal dilatation or surrounding inflammatory changes. Spleen: Normal in size without significant abnormality. Adrenals/Urinary Tract: Adrenal glands are unremarkable. Kidneys are normal, without renal calculi, solid lesion, or hydronephrosis. Bladder is unremarkable. Stomach/Bowel: Stomach is within normal limits. Appendix appears normal. No evidence of bowel wall thickening, distention, or inflammatory changes. Lymphatic: No enlarged abdominal or pelvic lymph nodes. Reproductive: No mass or other significant abnormality. Other: No abdominal wall hernia or abnormality. No ascites. Musculoskeletal: No acute osseous findings. Diffuse osseous sclerosis with unchanged lytic lesions of the L2 vertebral body (series 14, image 66) and the right ischium (series 10, image 307). CT THORACIC SPINE FINDINGS Alignment: Normal thoracic kyphosis. Vertebral bodies: Diffusely sclerotic vertebral bodies. No evidence of fracture or thoracic spine lytic lesion. Disc spaces: Mild disc space height loss and osteophytosis throughout the thoracic spine. Paraspinous soft tissues: Unremarkable. IMPRESSION: 1. Normal contour and caliber of the thoracic and abdominal aorta, without evidence of aneurysm, dissection, or other acute aortic pathology. Mild, scattered atherosclerosis. 2. No fracture or dislocation of the thoracic spine. Mild multilevel disc degenerative disease. 3. Extensive fine centrilobular nodularity and ground-glass throughout the lungs, new compared  to prior examination and consistent with atypical infection or inhalational inflammation such as hypersensitivity  pneumonitis. 4. Diffuse osseous sclerosis with unchanged lytic lesions of the L2 vertebral body and the right ischium, consistent with known diagnosis of multiple myeloma. No new osseous lesions or evidence of soft tissue involvement on this arterial phase examination. 5. Hepatic steatosis. 6. Coronary artery disease. Aortic Atherosclerosis (ICD10-I70.0). Electronically Signed   By: Delanna Ahmadi M.D.   On: 12/23/2021 10:30   CT Cervical Spine Wo Contrast  Result Date: 12/23/2021 CLINICAL DATA:  Syncope common back/neck pain EXAM: CT CERVICAL SPINE WITHOUT CONTRAST TECHNIQUE: Multidetector CT imaging of the cervical spine was performed without intravenous contrast. Multiplanar CT image reconstructions were also generated. RADIATION DOSE REDUCTION: This exam was performed according to the departmental dose-optimization program which includes automated exposure control, adjustment of the mA and/or kV according to patient size and/or use of iterative reconstruction technique. COMPARISON:  Cervical spine CT 01/29/2020 FINDINGS: Alignment: There is slight reversal of the normal cervical spine curvature centered at C5. There is no antero or retrolisthesis. There is no jumped or perched facets or other evidence of traumatic malalignment. Skull base and vertebrae: Skull base alignment is maintained. Vertebral body heights are preserved. There is no evidence of acute fracture. Soft tissues and spinal canal: No prevertebral fluid or swelling. No visible canal hematoma. Disc levels: There is disc space narrowing with associated degenerative endplate change most advanced at C5-C6 through C7-T1. Facet arthropathy is most advanced on the left at C3-C4. There is no evidence of high-grade spinal canal stenosis. Upper chest: The lungs are assessed on the separately dictated CTA chest. Other: None. IMPRESSION: No acute fracture or traumatic malalignment of the cervical spine. Electronically Signed   By: Valetta Mole M.D.   On:  12/23/2021 10:27   CT HEAD WO CONTRAST (5MM)  Result Date: 12/23/2021 CLINICAL DATA:  Syncope, neck/back pain EXAM: CT HEAD WITHOUT CONTRAST TECHNIQUE: Contiguous axial images were obtained from the base of the skull through the vertex without intravenous contrast. RADIATION DOSE REDUCTION: This exam was performed according to the departmental dose-optimization program which includes automated exposure control, adjustment of the mA and/or kV according to patient size and/or use of iterative reconstruction technique. COMPARISON:  CT head 01/29/2020 FINDINGS: Brain: There is no acute intracranial hemorrhage, extra-axial fluid collection, or acute infarct. Background parenchymal volume is normal. The ventricles are normal in size. Encephalomalacia in the left anterior frontal lobe is unchanged. 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 There is no mass lesion.  There is no mass effect or midline shift. Vascular: No hyperdense vessel or unexpected calcification. Skull: Normal. Negative for fracture or focal lesion. Sinuses/Orbits: The imaged paranasal sinuses are clear. The imaged globes and orbits are unremarkable. Other: None. IMPRESSION: 1. No acute intracranial pathology. 2. 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 Encephalomalacia in the left anterior frontal lobe is unchanged. Electronically Signed   By: Valetta Mole M.D.   On: 12/23/2021 10:23    Microbiology: Results for orders placed or performed during the hospital encounter of 03/23/21  Resp Panel by RT-PCR (Flu A&B, Covid) Nasopharyngeal Swab     Status: None   Collection Time: 03/23/21  5:36 PM   Specimen: Nasopharyngeal Swab; Nasopharyngeal(NP) swabs in vial transport medium  Result Value Ref Range Status   SARS Coronavirus 2 by RT PCR NEGATIVE NEGATIVE Final    Comment: (NOTE) SARS-CoV-2  target nucleic acids are NOT  DETECTED.  The SARS-CoV-2 RNA is generally detectable in upper respiratory specimens during the acute phase of infection. The lowest concentration of SARS-CoV-2 viral copies this assay can detect is 138 copies/mL. A negative result does not preclude SARS-Cov-2 infection and should not be used as the sole basis for treatment or other patient management decisions. A negative result may occur with  improper specimen collection/handling, submission of specimen other than nasopharyngeal swab, presence of viral mutation(s) within the areas targeted by this assay, and inadequate number of viral copies(<138 copies/mL). A negative result must be combined with clinical observations, patient history, and epidemiological information. The expected result is Negative.  Fact Sheet for Patients:  EntrepreneurPulse.com.au  Fact Sheet for Healthcare Providers:  IncredibleEmployment.be  This test is no t yet approved or cleared by the [place] FDA and  has been authorized for detection and/or diagnosis of SARS-CoV-2 by FDA under an Emergency Use Authorization (EUA). This EUA will remain  in effect (meaning this test can be used) for the duration of the COVID-19 declaration under Section 564(b)(1) of the Act, 21 U.S.C.section 360bbb-3(b)(1), unless the authorization is terminated  or revoked sooner.       Influenza A by PCR NEGATIVE NEGATIVE Final   Influenza B by PCR NEGATIVE NEGATIVE Final    Comment: (NOTE) The Xpert Xpress SARS-CoV-2/FLU/RSV plus assay is intended as an aid in the diagnosis of influenza from Nasopharyngeal swab specimens and should not be used as a sole basis for treatment. Nasal washings and aspirates are unacceptable for Xpert Xpress SARS-CoV-2/FLU/RSV testing.  Fact Sheet for Patients: EntrepreneurPulse.com.au  Fact Sheet for Healthcare Providers: IncredibleEmployment.be  This test is not yet  approved or cleared by the [place] FDA and has been authorized for detection and/or diagnosis of SARS-CoV-2 by FDA under an Emergency Use Authorization (EUA). This EUA will remain in effect (meaning this test can be used) for the duration of the COVID-19 declaration under Section 564(b)(1) of the Act, 21 U.S.C. section 360bbb-3(b)(1), unless the authorization is terminated or revoked.  Performed at [facility], [address] [place], [place] [address]     Labs: CBC: Recent Labs  Lab 12/23/21 0840 12/24/21 0546  WBC 6.3 6.0  NEUTROABS 4.2  --   HGB 12.5* 12.1*  HCT 40.9 38.5*  MCV 88.7 86.5  PLT 356 929   Basic Metabolic Panel: Recent Labs  Lab 12/23/21 0840 12/24/21 0546  NA 134* 137  K 4.4 3.9  CL 104 106  CO2 25 25  GLUCOSE 237* 158*  BUN 17 15  CREATININE 1.05 0.84  CALCIUM 9.5 9.1   Liver Function Tests: Recent Labs  Lab 12/23/21 0840  AST 14*  ALT 15  ALKPHOS 89  BILITOT 1.2  PROT 6.6  ALBUMIN 3.6   CBG: Recent Labs  Lab 12/23/21 1655 12/23/21 2138 12/24/21 0733 12/24/21 1236  GLUCAP 166* 164* 152* 128*    Discharge time spent: greater than 30 minutes.  Signed: Jennye Boroughs, MD [facility] 12/24/2021

## 2022-06-04 ENCOUNTER — Other Ambulatory Visit: Payer: Self-pay

## 2022-06-04 ENCOUNTER — Emergency Department (HOSPITAL_COMMUNITY): Payer: BC Managed Care – PPO

## 2022-06-04 ENCOUNTER — Encounter (HOSPITAL_COMMUNITY): Payer: Self-pay

## 2022-06-04 ENCOUNTER — Emergency Department (HOSPITAL_COMMUNITY)
Admission: EM | Admit: 2022-06-04 | Discharge: 2022-06-05 | Disposition: A | Discharge status: Home / Self Care | Payer: BC Managed Care – PPO | Attending: Emergency Medicine | Admitting: Emergency Medicine

## 2022-06-04 DIAGNOSIS — I10 Essential (primary) hypertension: Secondary | ICD-10-CM | POA: Insufficient documentation

## 2022-06-04 DIAGNOSIS — J069 Acute upper respiratory infection, unspecified: Secondary | ICD-10-CM | POA: Insufficient documentation

## 2022-06-04 DIAGNOSIS — Z79899 Other long term (current) drug therapy: Secondary | ICD-10-CM | POA: Diagnosis not present

## 2022-06-04 DIAGNOSIS — Z1152 Encounter for screening for COVID-19: Secondary | ICD-10-CM | POA: Insufficient documentation

## 2022-06-04 DIAGNOSIS — Z8579 Personal history of other malignant neoplasms of lymphoid, hematopoietic and related tissues: Secondary | ICD-10-CM | POA: Insufficient documentation

## 2022-06-04 DIAGNOSIS — R059 Cough, unspecified: Secondary | ICD-10-CM | POA: Diagnosis present

## 2022-06-04 LAB — CBC WITH DIFFERENTIAL/PLATELET
Abs Immature Granulocytes: 0.03 10*3/uL (ref 0.00–0.07)
Basophils Absolute: 0 10*3/uL (ref 0.0–0.1)
Basophils Relative: 0 %
Eosinophils Absolute: 0.1 10*3/uL (ref 0.0–0.5)
Eosinophils Relative: 2 %
HCT: 40.4 % (ref 39.0–52.0)
Hemoglobin: 12.8 g/dL — ABNORMAL LOW (ref 13.0–17.0)
Immature Granulocytes: 1 %
Lymphocytes Relative: 24 %
Lymphs Abs: 1.1 10*3/uL (ref 0.7–4.0)
MCH: 27.2 pg (ref 26.0–34.0)
MCHC: 31.7 g/dL (ref 30.0–36.0)
MCV: 86 fL (ref 80.0–100.0)
Monocytes Absolute: 0.7 10*3/uL (ref 0.1–1.0)
Monocytes Relative: 15 %
Neutro Abs: 2.8 10*3/uL (ref 1.7–7.7)
Neutrophils Relative %: 58 %
Platelets: 272 10*3/uL (ref 150–400)
RBC: 4.7 MIL/uL (ref 4.22–5.81)
RDW: 19.4 % — ABNORMAL HIGH (ref 11.5–15.5)
WBC: 4.8 10*3/uL (ref 4.0–10.5)
nRBC: 0 % (ref 0.0–0.2)

## 2022-06-04 NOTE — ED Triage Notes (Addendum)
Pt reports productive cough of green/yellowish phlegm onset 3 days ago associated with burning eyes bilaterally and head/neck pain. Denies chest pain.  Pt reports he receives chemo treatment once a week for Multiple Myeloma.

## 2022-06-04 NOTE — ED Provider Triage Note (Signed)
Emergency Medicine Provider Triage Evaluation Note  Kevin Lowery , a 61 y.o. male  was evaluated in triage.  Pt complains of cough, phlegm, shob, shob with exertion without chest pain, burning eyes, and some neck pain a few days ago but no neck pain present at this time. Hx of DVT and still taking Xarelto. Denies recent leg swelling.  Taking immunotherapy for multiple myeloma once weekly.   Review of Systems  Positive: Shob, phlegm, cough Negative: Chest pain  Physical Exam  BP 137/82 (BP Location: Right Arm)   Pulse 90   Temp 98.6 F (37 C) (Oral)   Resp 14   Ht _0  (1.753 m)   Wt 110.7 kg   SpO2 99%   BMI 36.03 kg/m  Gen:   Awake, no distress   Resp:  Normal effort  MSK:   Moves extremities without difficulty  Other:  Clear breath sound bilaterally, no respiratory distress, no signs of fluid overload, peripheral edema  Medical Decision Making  Medically screening exam initiated at 10:28 PM.  Appropriate orders placed.  Kevin Lowery was informed that the remainder of the evaluation will be completed by another provider, this initial triage assessment does not replace that evaluation, and the importance of remaining in the ED until their evaluation is complete.  Workup initiated   Kevin Lowery, [place] 06/04/22 2230

## 2022-06-05 LAB — BASIC METABOLIC PANEL
Anion gap: 10 (ref 5–15)
BUN: 17 mg/dL (ref 8–23)
CO2: 23 mmol/L (ref 22–32)
Calcium: 9.3 mg/dL (ref 8.9–10.3)
Chloride: 106 mmol/L (ref 98–111)
Creatinine, Ser: 1.32 mg/dL — ABNORMAL HIGH (ref 0.61–1.24)
GFR, Estimated: >60 mL/min (ref >60)
Glucose, Bld: 78 mg/dL (ref 70–99)
Potassium: 4.1 mmol/L (ref 3.5–5.1)
Sodium: 139 mmol/L (ref 135–145)

## 2022-06-05 LAB — RESP PANEL BY RT-PCR (FLU A&B, COVID) ARPGX2
Influenza A by PCR: NEGATIVE
Influenza B by PCR: NEGATIVE
SARS Coronavirus 2 by RT PCR: NEGATIVE

## 2022-06-05 LAB — TROPONIN I (HIGH SENSITIVITY)
Troponin I (High Sensitivity): 5 ng/L (ref <18)
Troponin I (High Sensitivity): 4 ng/L (ref <18)

## 2022-06-05 MED ORDER — ACETAMINOPHEN 325 MG PO TABS
650.0000 mg | ORAL_TABLET | Freq: Once | ORAL | Status: AC
  Administered 2022-06-05: 650 mg via ORAL
  Filled 2022-06-05: qty 2

## 2022-06-05 NOTE — ED Provider Notes (Signed)
[facility] EMERGENCY DEPARTMENT Provider Note   CSN: [id number] Arrival date & time: 06/04/22  2202     History  Chief Complaint  Patient presents with   Cough    Kevin Lowery is a 61 y.o. male.  The history is provided by the patient.  Patient with history of multiple myeloma presents with cough.  He reports over the past several days he has had increasing cough, congestion shortness of breath and neck pain.  He also reports burning eyes.  No chest pain.  No vomiting.   He does have previous history of VTE and on anticoagulation  He reports currently being treated for multiple myeloma but has been stable Past Medical History:  Diagnosis Date   Arthritis    Cancer ([place])    Multiple Myeloma   Gout    Gout    History of DVT of lower extremity    Anticoagulated for a couple of months   Hyperlipidemia    Hypertension     Home Medications Prior to Admission medications   Medication Sig Start Date End Date Taking? Authorizing Provider  allopurinol (ZYLOPRIM) 300 MG tablet Take 450 mg by mouth daily. 08/17/17   [provider]  carvedilol (COREG) 25 MG tablet Take 1.5 tablets by mouth in the morning and at bedtime. 12/28/20   [provider]  cyanocobalamin 1000 MCG tablet Take 1,000 mcg by mouth daily.    [provider]  DULoxetine (CYMBALTA) 60 MG capsule Take 60 mg by mouth daily. 01/28/21   [provider]  ENTRESTO 49-51 MG Take 1 tablet by mouth 2 (two) times daily. 01/21/21   [provider]  furosemide (LASIX) 20 MG tablet Take 20 mg by mouth daily. 01/21/21   [provider]  glipiZIDE (GLUCOTROL) 5 MG tablet Take 1 tablet (5 mg total) by mouth 2 (two) times daily before a meal. 12/24/21   Jennye Boroughs, MD  JARDIANCE 10 MG TABS tablet Take 10 mg by mouth daily. 02/22/21   [provider]  metFORMIN (GLUCOPHAGE-XR) 750 MG 24 hr tablet Take 1 tablet (750 mg total) by mouth daily with  breakfast. 12/25/21   Jennye Boroughs, MD  pantoprazole (PROTONIX) 40 MG tablet Take 40 mg by mouth daily. 01/31/21   [provider]  POMALYST 3 MG capsule Take by mouth. 02/19/21   [provider]  potassium chloride SA (KLOR-CON) 20 MEQ tablet Take 20 mEq by mouth 2 (two) times daily. 12/30/20   [provider]  pregabalin (LYRICA) 150 MG capsule Take 150 mg by mouth 3 (three) times daily. 03/11/21   [provider]  spironolactone (ALDACTONE) 25 MG tablet Take 25 mg by mouth daily. 01/18/21   [provider]  XARELTO 20 MG TABS tablet Take 20 mg by mouth daily with supper. 01/23/21   [provider]      Allergies    Hydrocodone and Oxycodone    Review of Systems   Review of Systems  Constitutional:  Negative for fever.  Respiratory:  Positive for cough and shortness of breath.   Cardiovascular:  Negative for chest pain.    Physical Exam Updated Vital Signs BP 123/88   Pulse 78   Temp 98.9 F (37.2 C)   Resp 20   Ht 1.753 m (_0 )   Wt 110.7 kg   SpO2 97%   BMI 36.03 kg/m  Physical Exam CONSTITUTIONAL: Well developed/well nourished HEAD: Normocephalic/atraumatic EYES: EOMI/PERRL, no conjunctival erythema ENMT: Mucous membranes  moist, uvula midline, no stridor or drooling NECK: supple no meningeal signs SPINE/BACK:entire spine nontender CV: S1/S2 noted, no murmurs/rubs/gallops noted LUNGS: Lungs are clear to auscultation bilaterally, no apparent distress ABDOMEN: soft, nontender, no rebound or guarding, bowel sounds noted throughout abdomen GU:no cva tenderness NEURO: Pt is awake/alert/appropriate, moves all extremitiesx4.  No facial droop.   EXTREMITIES: pulses normal/equal, full ROM SKIN: warm, color normal PSYCH: no abnormalities of mood noted, alert and oriented to situation  ED Results / Procedures / Treatments   Labs (all labs ordered are listed, but only abnormal results are displayed) Labs Reviewed  CBC WITH  DIFFERENTIAL/PLATELET - Abnormal; Notable for the following components:      Result Value   Hemoglobin 12.8 (*)    RDW 19.4 (*)    All other components within normal limits  BASIC METABOLIC PANEL - Abnormal; Notable for the following components:   Creatinine, Ser 1.32 (*)    All other components within normal limits  RESP PANEL BY RT-PCR (FLU A&B, COVID) [id number]  TROPONIN I (HIGH SENSITIVITY)  TROPONIN I (HIGH SENSITIVITY)    EKG EKG Interpretation  Date/Time:  Thursday June 05 2022 01:04:50 EST Ventricular Rate:  81 PR Interval:  184 QRS Duration: 86 QT Interval:  375 QTC Calculation: 436 R Axis:   37 Text Interpretation: Sinus rhythm Interpretation limited secondary to artifact Confirmed by Ripley Fraise [phone]) on 06/05/2022 1:28:13 AM  Radiology DG Chest 2 View  Result Date: 06/04/2022 CLINICAL DATA:  Shortness of breath, cough. EXAM: CHEST - 2 VIEW COMPARISON:  None Available. FINDINGS: The heart size and mediastinal contours are stable. Minimal atelectasis or scarring is noted bilaterally. Lung volumes are low. No consolidation, effusion, or pneumothorax. A stable chest port is noted on the right. Degenerative changes are present in the thoracic spine. No acute osseous abnormality. Surgical clips are present in the upper abdomen. IMPRESSION: Stable chest with no active cardiopulmonary disease. Electronically Signed   By: Brett Fairy M.D.   On: 06/04/2022 22:58    Procedures Procedures    Medications Ordered in ED Medications  acetaminophen (TYLENOL) tablet 650 mg (650 mg Oral Given 06/05/22 0117)    ED Course/ Medical Decision Making/ A&P Clinical Course as of 06/05/22 0253  Thu Jun 05, 2022  0058 Creatinine(!): 1.32 Renal insufficiency [DW]  0148 Patient very well-appearing.  He was concerned he has pneumonia.  X-ray is negative.  However he could have viral illness that includes COVID or flu, viral panel is pending at this time [DW]  0252 Resting  comfortably.  Viral panel negative.  Patient reports his biggest concern is the cough and phlegm production.  Advised that he likely has viral URI that will resolve spontaneously.  He is safe for discharge home [DW]    Clinical Course User Index [DW] Ripley Fraise, MD                           Medical Decision Making Amount and/or Complexity of Data Reviewed Labs:  Decision-making details documented in ED Course.  Risk OTC drugs.   This patient presents to the ED for concern of cough, this involves an extensive number of treatment options, and is a complaint that carries with it a high risk of complications and morbidity.  The differential diagnosis includes but is not limited to Acute coronary syndrome, pneumonia, acute pulmonary edema, pneumothorax, acute anemia, pulmonary embolism   Comorbidities that complicate the patient evaluation: Patient's presentation is complicated  by their history of multiple myeloma  Additional history obtained: Records reviewed Care Everywhere/External Records  Lab Tests: I Ordered, and personally interpreted labs.  The pertinent results include: Renal insufficiency  Imaging Studies ordered: I ordered imaging studies including X-ray chest   I independently visualized and interpreted imaging which showed no acute findings I agree with the radiologist interpretation  Cardiac Monitoring: The patient was maintained on a cardiac monitor.  I personally viewed and interpreted the cardiac monitor which showed an underlying rhythm of:  sinus rhythm  Medicines ordered and prescription drug management: I ordered medication including Tylenol for pain Reevaluation of the patient after these medicines showed that the patient    improved  Reevaluation: After the interventions noted above, I reevaluated the patient and found that they have :improved  Complexity of problems addressed: Patient's presentation is most consistent with  acute presentation with  potential threat to life or bodily function  Disposition: After consideration of the diagnostic results and the patient's response to treatment,  I feel that the patent would benefit from discharge   .           Final Clinical Impression(s) / ED Diagnoses Final diagnoses:  Viral URI with cough    Rx / DC Orders ED Discharge Orders     None         Ripley Fraise, MD 06/05/22 [phone]

## 2022-07-13 ENCOUNTER — Emergency Department
Admission: EM | Admit: 2022-07-13 | Discharge: 2022-07-13 | Disposition: A | Discharge status: Home / Self Care | Payer: BC Managed Care – PPO | Attending: Emergency Medicine | Admitting: Emergency Medicine

## 2022-07-13 ENCOUNTER — Other Ambulatory Visit: Payer: Self-pay

## 2022-07-13 ENCOUNTER — Encounter: Payer: Self-pay | Admitting: Emergency Medicine

## 2022-07-13 ENCOUNTER — Emergency Department: Payer: BC Managed Care – PPO

## 2022-07-13 DIAGNOSIS — J069 Acute upper respiratory infection, unspecified: Secondary | ICD-10-CM | POA: Diagnosis not present

## 2022-07-13 DIAGNOSIS — R531 Weakness: Secondary | ICD-10-CM | POA: Diagnosis not present

## 2022-07-13 DIAGNOSIS — Z1152 Encounter for screening for COVID-19: Secondary | ICD-10-CM | POA: Insufficient documentation

## 2022-07-13 DIAGNOSIS — R059 Cough, unspecified: Secondary | ICD-10-CM | POA: Diagnosis present

## 2022-07-13 LAB — COMPREHENSIVE METABOLIC PANEL
ALT: 15 U/L (ref 0–44)
AST: 19 U/L (ref 15–41)
Albumin: 4 g/dL (ref 3.5–5.0)
Alkaline Phosphatase: 69 U/L (ref 38–126)
Anion gap: 6 (ref 5–15)
BUN: 17 mg/dL (ref 8–23)
CO2: 24 mmol/L (ref 22–32)
Calcium: 10.1 mg/dL (ref 8.9–10.3)
Chloride: 109 mmol/L (ref 98–111)
Creatinine, Ser: 1.3 mg/dL — ABNORMAL HIGH (ref 0.61–1.24)
GFR, Estimated: >60 mL/min (ref >60)
Glucose, Bld: 82 mg/dL (ref 70–99)
Potassium: 4.6 mmol/L (ref 3.5–5.1)
Sodium: 139 mmol/L (ref 135–145)
Total Bilirubin: 0.8 mg/dL (ref 0.3–1.2)
Total Protein: 6.8 g/dL (ref 6.5–8.1)

## 2022-07-13 LAB — CBC WITH DIFFERENTIAL/PLATELET
Abs Immature Granulocytes: 0.02 10*3/uL (ref 0.00–0.07)
Basophils Absolute: 0 10*3/uL (ref 0.0–0.1)
Basophils Relative: 1 %
Eosinophils Absolute: 0 10*3/uL (ref 0.0–0.5)
Eosinophils Relative: 1 %
HCT: 42.5 % (ref 39.0–52.0)
Hemoglobin: 13.3 g/dL (ref 13.0–17.0)
Immature Granulocytes: 1 %
Lymphocytes Relative: 27 %
Lymphs Abs: 1.2 10*3/uL (ref 0.7–4.0)
MCH: 26.2 pg (ref 26.0–34.0)
MCHC: 31.3 g/dL (ref 30.0–36.0)
MCV: 83.8 fL (ref 80.0–100.0)
Monocytes Absolute: 0.6 10*3/uL (ref 0.1–1.0)
Monocytes Relative: 13 %
Neutro Abs: 2.5 10*3/uL (ref 1.7–7.7)
Neutrophils Relative %: 57 %
Platelets: 280 10*3/uL (ref 150–400)
RBC: 5.07 MIL/uL (ref 4.22–5.81)
RDW: 17.9 % — ABNORMAL HIGH (ref 11.5–15.5)
WBC: 4.3 10*3/uL (ref 4.0–10.5)
nRBC: 0 % (ref 0.0–0.2)

## 2022-07-13 LAB — RESP PANEL BY RT-PCR (RSV, FLU A&B, COVID)  RVPGX2
Influenza A by PCR: NEGATIVE
Influenza B by PCR: NEGATIVE
Resp Syncytial Virus by PCR: NEGATIVE
SARS Coronavirus 2 by RT PCR: NEGATIVE

## 2022-07-13 MED ORDER — GUAIFENESIN ER 600 MG PO TB12: 600.0000 mg | ORAL_TABLET | Freq: Two times a day (BID) | ORAL | 0 refills | Status: AC | PRN

## 2022-07-13 MED ORDER — PSEUDOEPHEDRINE HCL 60 MG PO TABS: 60.0000 mg | ORAL_TABLET | Freq: Four times a day (QID) | ORAL | 0 refills | Status: AC | PRN

## 2022-07-13 NOTE — ED Provider Notes (Signed)
[facility] Provider Note    Event Date/Time   First MD Initiated Contact with Patient 07/13/22 1416     (approximate)   History   Cough   HPI  Kevin Lowery is a 61 y.o. male with a history of multiple myeloma who presents with cough for the last 2 days associated with nasal congestion and some fatigue and generalized weakness.  The patient denies any significant shortness of breath or chest pain.  He has no vomiting or diarrhea.  He denies any fever or chills.  The patient's wife was diagnosed with the flu last week.  I reviewed the past medical records.  The patient was most recently seen at the [facility] ED on 11/23 for cough as well.  Work up was negative at that time.  He follows at [facility] oncology for the multiple myeloma and was last seen on 12/13 and is receiving a isatuximab maintenance.  Physical Exam   Triage Vital Signs: ED Triage Vitals  Enc Vitals Group     BP 07/13/22 1340 (!) 126/92     Pulse Rate 07/13/22 1340 82     Resp 07/13/22 1340 20     Temp 07/13/22 1340 98.1 F (36.7 C)     Temp Source 07/13/22 1340 Oral     SpO2 07/13/22 1340 98 %     Weight 07/13/22 1337 240 lb (108.9 kg)     Height 07/13/22 1337 _0  (1.753 m)     Head Circumference --      Peak Flow --      Pain Score 07/13/22 1337 0     Pain Loc --      Pain Edu? --      Excl. in [place]? --     Most recent vital signs: Vitals:   07/13/22 1340  BP: (!) 126/92  Pulse: 82  Resp: 20  Temp: 98.1 F (36.7 C)  SpO2: 98%     General: Awake, no distress.  CV:  Good peripheral perfusion.  Normal heart sounds. Resp:  Normal effort.  Lungs CTAB. Abd:  No distention.  Other:  Oropharynx clear.  No peripheral edema.   ED Results / Procedures / Treatments   Labs (all labs ordered are listed, but only abnormal results are displayed) Labs Reviewed  CBC WITH DIFFERENTIAL/PLATELET - Abnormal; Notable for the following components:      Result Value   RDW 17.9 (*)     All other components within normal limits  COMPREHENSIVE METABOLIC PANEL - Abnormal; Notable for the following components:   Creatinine, Ser 1.30 (*)    All other components within normal limits  RESP PANEL BY RT-PCR (RSV, FLU A&B, COVID)  [id number]     EKG    RADIOLOGY  Chest x-ray: I independently viewed and interpreted the images; there is no focal consolidation or edema   PROCEDURES:  Critical Care performed: No  Procedures   MEDICATIONS ORDERED IN ED: Medications - No data to display   IMPRESSION / MDM / [place] / ED COURSE  I reviewed the triage vital signs and the nursing notes.  61 year old male with PMH as noted above presents with cough and fatigue over the last 2 days.  His wife was recently diagnosed with influenza.  On exam the patient is well-appearing with normal vital signs and a normal O2 saturation.  Physical exam is unremarkable for acute findings.  Basic labs obtained from triage are also unremarkable with no leukocytosis and  normal electrolytes.  Creatinine is stable.  Differential diagnosis includes, but is not limited to, influenza, COVID-19, RSV, other viral syndrome, less likely bacterial pneumonia.  We will obtain chest x-ray, respiratory panel, and reassess.  Patient's presentation is most consistent with acute complicated illness / injury requiring diagnostic workup.  ----------------------------------------- 3:28 PM on 07/13/2022 -----------------------------------------  Respiratory panel is negative and the chest x-ray shows no acute abnormalities.  Presentations consistent with a viral URI/mild bronchitis.  I prescribed pseudoephedrine and guaifenesin for symptomatic treatment.  I gave the patient strict return precautions and he expressed understanding.  He is stable for discharge at this time.  He will follow-up with his PMD.   FINAL CLINICAL IMPRESSION(S) / ED DIAGNOSES   Final diagnoses:  Viral URI with cough     Rx / DC  Orders   ED Discharge Orders          Ordered    pseudoephedrine (SUDAFED) 60 MG tablet  Every 6 hours PRN        07/13/22 1513    guaiFENesin (MUCINEX) 600 MG 12 hr tablet  2 times daily PRN        07/13/22 1513             Note:  This document was prepared using Dragon voice recognition software and may include unintentional dictation errors.   Arta Silence, MD 07/13/22 [phone]

## 2022-07-13 NOTE — Discharge Instructions (Signed)
Take the guaifenesin as needed for cough and the pseudoephedrine for congestion.  You can take Tylenol as needed for body aches.  Make sure to drink plenty of fluids.  Follow-up with your regular doctor.  Return to the ER for new, worsening, or persistent severe cough, fatigue, shortness of breath, or any other new or worsening symptoms that concern you.

## 2022-07-13 NOTE — ED Triage Notes (Signed)
Pt via POV from home. Pt c/o cough and congestion for the past 2 days. Wife was dx with flu. Pt has a hx of multiple myeloma and currently getting treatment at [facility]. Pt is A&Ox4 and NAD

## 2023-02-23 ENCOUNTER — Other Ambulatory Visit: Payer: Self-pay

## 2023-02-23 ENCOUNTER — Emergency Department
Admission: EM | Admit: 2023-02-23 | Discharge: 2023-02-23 | Disposition: A | Discharge status: Home / Self Care | Payer: 59 | Attending: Emergency Medicine | Admitting: Emergency Medicine

## 2023-02-23 DIAGNOSIS — I110 Hypertensive heart disease with heart failure: Secondary | ICD-10-CM | POA: Diagnosis not present

## 2023-02-23 DIAGNOSIS — I509 Heart failure, unspecified: Secondary | ICD-10-CM | POA: Diagnosis not present

## 2023-02-23 DIAGNOSIS — R55 Syncope and collapse: Secondary | ICD-10-CM | POA: Insufficient documentation

## 2023-02-23 DIAGNOSIS — Z1152 Encounter for screening for COVID-19: Secondary | ICD-10-CM | POA: Diagnosis not present

## 2023-02-23 DIAGNOSIS — E119 Type 2 diabetes mellitus without complications: Secondary | ICD-10-CM | POA: Insufficient documentation

## 2023-02-23 DIAGNOSIS — R531 Weakness: Secondary | ICD-10-CM | POA: Diagnosis present

## 2023-02-23 LAB — CBC
HCT: 49.8 % (ref 39.0–52.0)
Hemoglobin: 16 g/dL (ref 13.0–17.0)
MCH: 28.7 pg (ref 26.0–34.0)
MCHC: 32.1 g/dL (ref 30.0–36.0)
MCV: 89.4 fL (ref 80.0–100.0)
Platelets: 242 10*3/uL (ref 150–400)
RBC: 5.57 MIL/uL (ref 4.22–5.81)
RDW: 18.2 % — ABNORMAL HIGH (ref 11.5–15.5)
WBC: 4.9 10*3/uL (ref 4.0–10.5)
nRBC: 1.2 % — ABNORMAL HIGH (ref 0.0–0.2)

## 2023-02-23 LAB — BASIC METABOLIC PANEL
Anion gap: 10 (ref 5–15)
BUN: 17 mg/dL (ref 8–23)
CO2: 21 mmol/L — ABNORMAL LOW (ref 22–32)
Calcium: 9.3 mg/dL (ref 8.9–10.3)
Chloride: 106 mmol/L (ref 98–111)
Creatinine, Ser: 1.45 mg/dL — ABNORMAL HIGH (ref 0.61–1.24)
GFR, Estimated: 54 mL/min — ABNORMAL LOW (ref >60)
Glucose, Bld: 119 mg/dL — ABNORMAL HIGH (ref 70–99)
Potassium: 4 mmol/L (ref 3.5–5.1)
Sodium: 137 mmol/L (ref 135–145)

## 2023-02-23 LAB — SARS CORONAVIRUS 2 BY RT PCR: SARS Coronavirus 2 by RT PCR: NEGATIVE

## 2023-02-23 LAB — TROPONIN I (HIGH SENSITIVITY): Troponin I (High Sensitivity): 6 ng/L (ref <18)

## 2023-02-23 NOTE — ED Provider Notes (Signed)
[facility] Provider Note   Event Date/Time   First MD Initiated Contact with Patient 02/23/23 1521     (approximate) History  Weakness  HPI Kevin Lowery is a 62 y.o. male with a stated past medical history of hypertension, DVTs, multiple myeloma, and heart failure as well as diabetes who presents after lightheadedness and scintillating scotoma while shaving today.  Patient states that when he was shaving his neck he began feeling weakness, palpitations, lightheaded, and states that he got to the floor to lay down.  And symptoms resolved within the next 5 minutes.  Patient denies any full loss of consciousness.  Patient denies any symptoms similar to this in the past. ROS: Patient currently denies any vision changes, tinnitus, difficulty speaking, facial droop, sore throat, chest pain, shortness of breath, abdominal pain, nausea/vomiting/diarrhea, dysuria, or weakness/numbness/paresthesias in any extremity   Physical Exam  Triage Vital Signs: ED Triage Vitals [02/23/23 1353]  Encounter Vitals Group     BP 113/85     Systolic BP Percentile      Diastolic BP Percentile      Pulse Rate 90     Resp 19     Temp 97.9 F (36.6 C)     Temp Source Oral     SpO2 98 %     Weight 250 lb (113.4 kg)     Height 5\' 9"  (1.753 m)     Head Circumference      Peak Flow      Pain Score 0     Pain Loc      Pain Education      Exclude from Growth Chart    Most recent vital signs: Vitals:   02/23/23 1353 02/23/23 1530  BP: 113/85 (!) 115/92  Pulse: 90 92  Resp: 19   Temp: 97.9 F (36.6 C)   SpO2: 98% 100%   General: Awake, oriented x4. CV:  Good peripheral perfusion.  Resp:  Normal effort.  Abd:  No distention.  Other:  Middle-aged overweight African-American male laying in bed in no acute distress ED Results / Procedures / Treatments  Labs (all labs ordered are listed, but only abnormal results are displayed) Labs Reviewed  BASIC METABOLIC PANEL - Abnormal;  Notable for the following components:      Result Value   CO2 21 (*)    Glucose, Bld 119 (*)    Creatinine, Ser 1.45 (*)    GFR, Estimated 54 (*)    All other components within normal limits  CBC - Abnormal; Notable for the following components:   RDW 18.2 (*)    nRBC 1.2 (*)    All other components within normal limits  SARS CORONAVIRUS 2 BY RT PCR  TROPONIN I (HIGH SENSITIVITY)   EKG ED ECG REPORT I, Merwyn Katos, the attending physician, personally viewed and interpreted this ECG. Date: 02/23/2023 EKG Time: 1400 Rate: 93 Rhythm: normal sinus rhythm QRS Axis: normal Intervals: normal ST/T Wave abnormalities: normal Narrative Interpretation: no evidence of acute ischemia PROCEDURES: Critical Care performed: No Procedures MEDICATIONS ORDERED IN ED: Medications - No data to display IMPRESSION / MDM / ASSESSMENT AND PLAN / ED COURSE  I reviewed the triage vital signs and the nursing notes.                             The patient is on the cardiac monitor to evaluate for evidence of arrhythmia and/or significant  heart rate changes.* Patient's presentation is most consistent with acute presentation with potential threat to life or bodily function. Patient presents with complaints of syncope/presyncope ED Workup:  CBC, BMP, Troponin, BNP, ECG, CXR Differential diagnosis includes HF, ICH, seizure, stroke, HOCM, ACS, aortic dissection, malignant arrhythmia, or GI bleed. Findings: No evidence of acute laboratory abnormalities.  Troponin negative x1 EKG: No e/o STEMI. No evidence of Brugada's sign, delta wave, epsilon wave, significantly prolonged QTc, or malignant arrhythmia.  I suspect patient may have had bradycardia/hypotension secondary to carotid massage during shaving.  Patient states that this occurred while shaving the left side of his neck and resolved spontaneously after he stopped pressing on his neck.  I informed patient of this concern and watching for any persistent  symptoms while shaving in the future.  Disposition: Discharge. Patient is at baseline at this time. Return precautions expressed and understood in person. Advised follow up with primary care provider or clinic physician in next 24 hours.   FINAL CLINICAL IMPRESSION(S) / ED DIAGNOSES   Final diagnoses:  Vasovagal near syncope   Rx / DC Orders   ED Discharge Orders     None      Note:  This document was prepared using Dragon voice recognition software and may include unintentional dictation errors.   Merwyn Katos, MD 02/24/23 [phone]

## 2023-02-23 NOTE — ED Triage Notes (Signed)
Pt sts that he was at home shaving when he started to see spots and felt like he was going to pass out. Pt sts that he is just feeling weak at this time.

## 2023-11-18 IMAGING — CT CT CERVICAL SPINE W/O CM
3 of 4 series · 13 of 33 positions shown, 16 images · non-contrast
Comparison: Cervical spine CT 01/29/2020

CLINICAL DATA: Syncope common back/neck pain



[Series 7: sag bone · sagittal · 0.34mm/px · 5 of 79 slices shown, 6 images]
[im 27/79  bone]
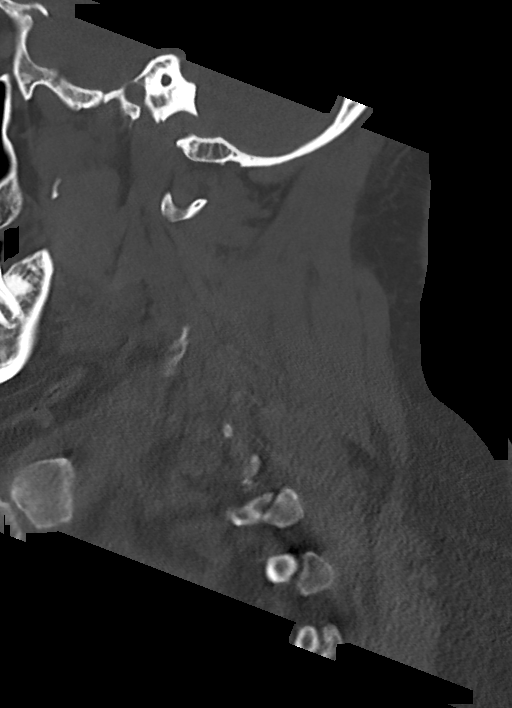
[im 33/79  bone]
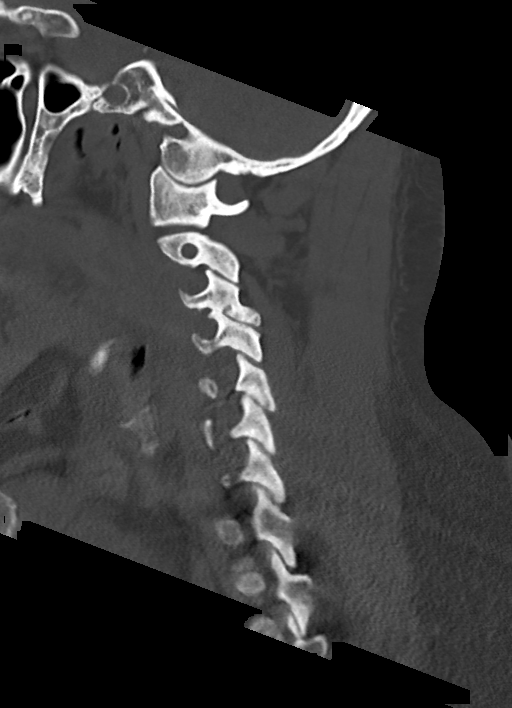
[im 40/79  soft-tissue]
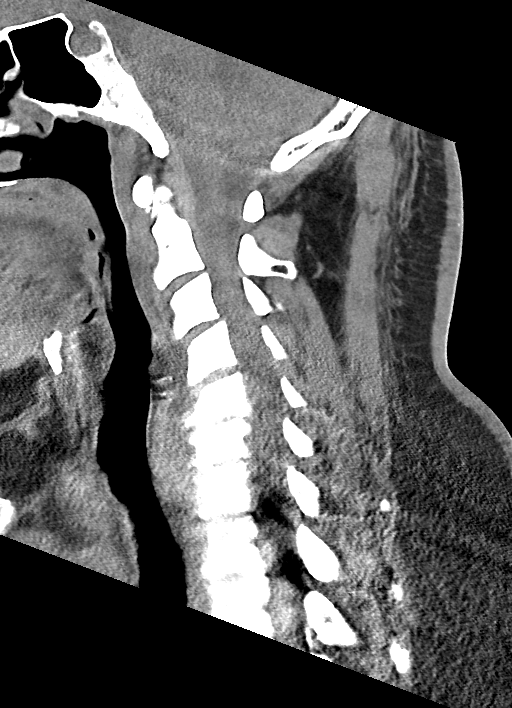
[im 40/79  bone]
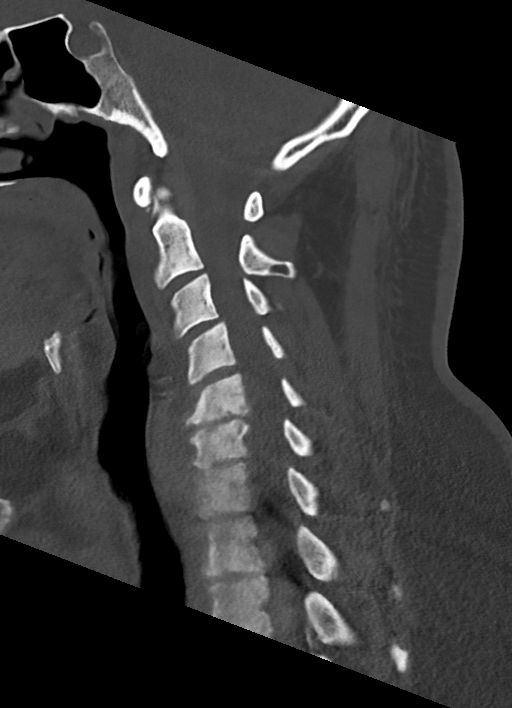
[im 46/79  bone]
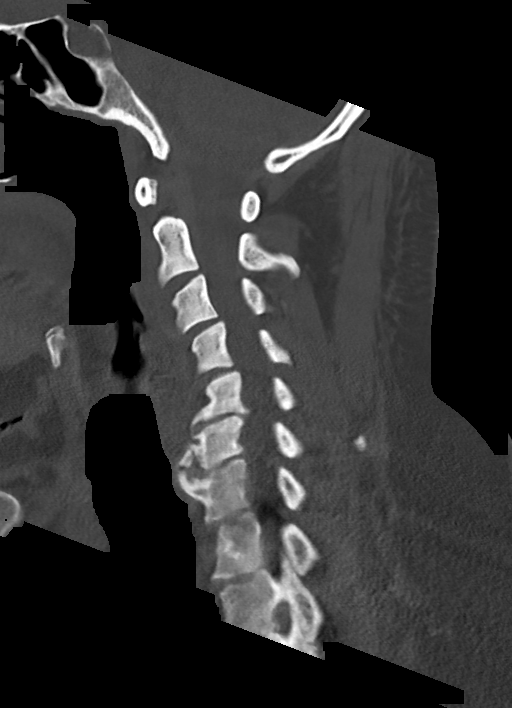
[im 53/79  bone]
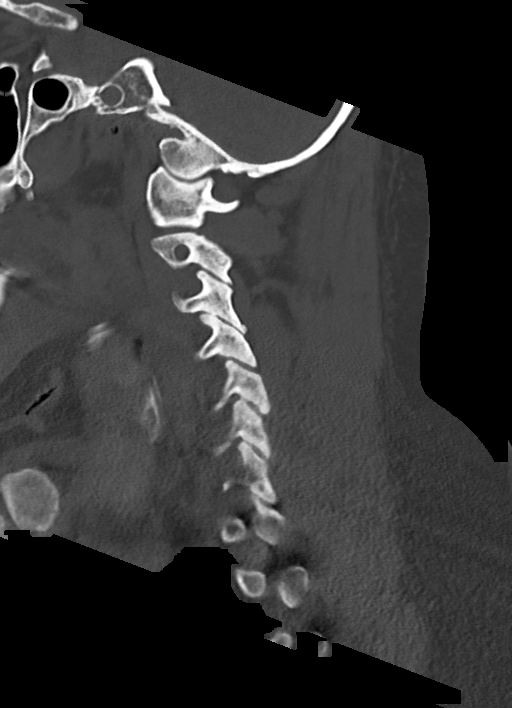

[Series 8: cor bone · coronal · 0.37mm/px · 3 of 67 slices shown]
[im 15/67  bone]
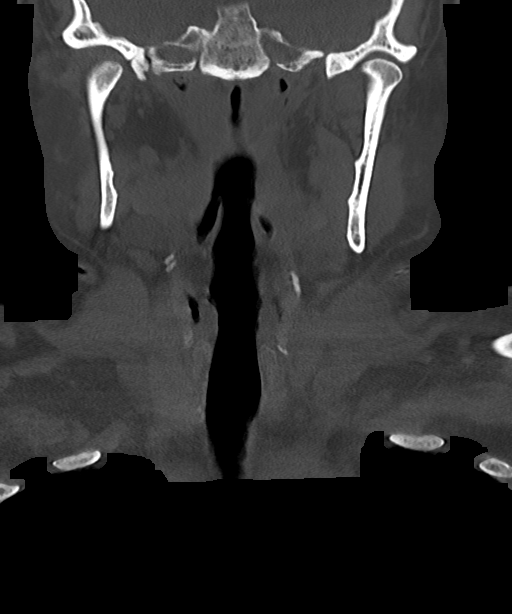
[im 27/67  bone]
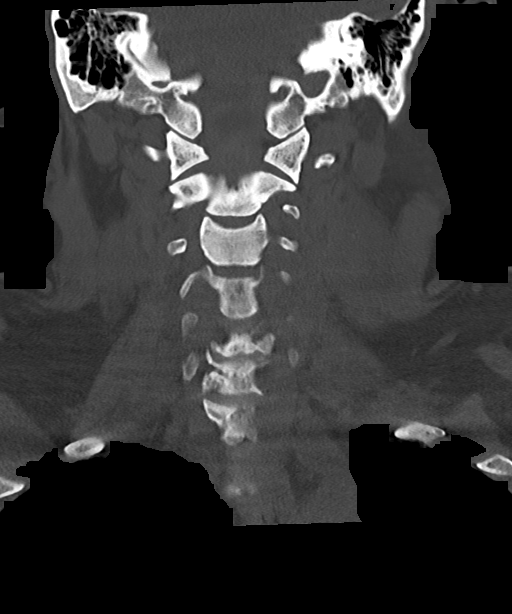
[im 40/67  bone]
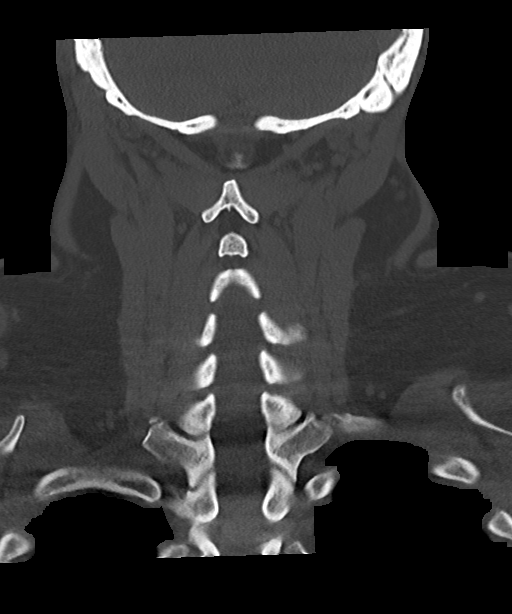

[Series 10: orthogonal axials · axial · 0.21mm/px · z∈[-286,-178]mm · 5 of 107 slices shown, 7 images]
[im 16/107  soft-tissue]
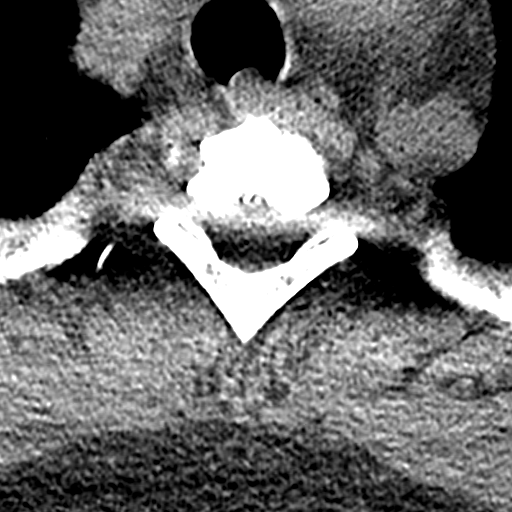
[im 16/107  bone]
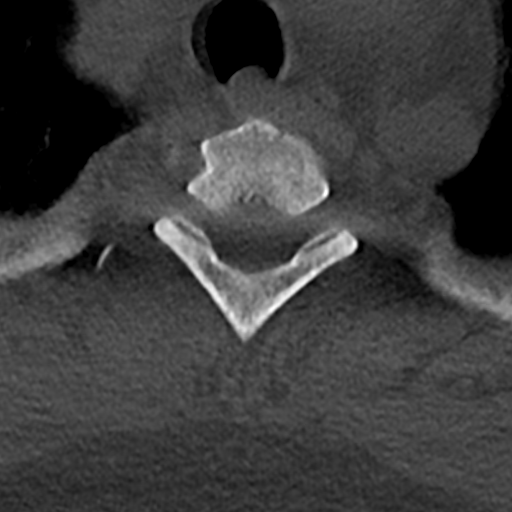
[im 31/107  bone]
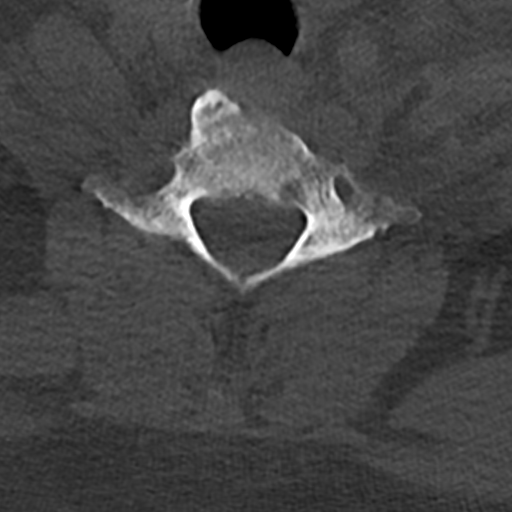
[im 61/107  bone]
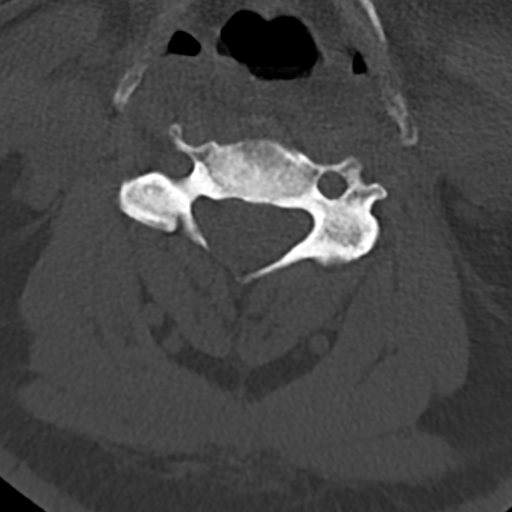
[im 76/107  bone]
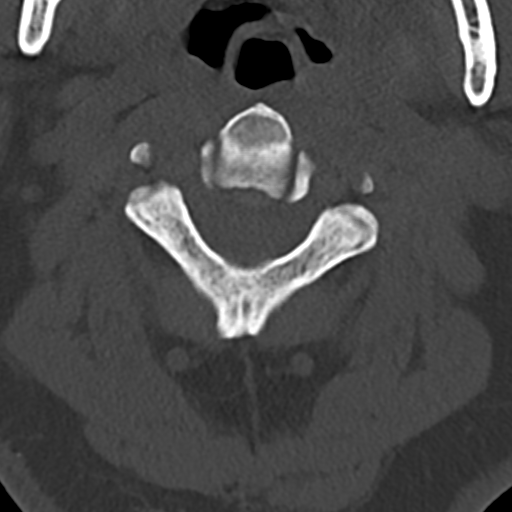
[im 91/107  soft-tissue]
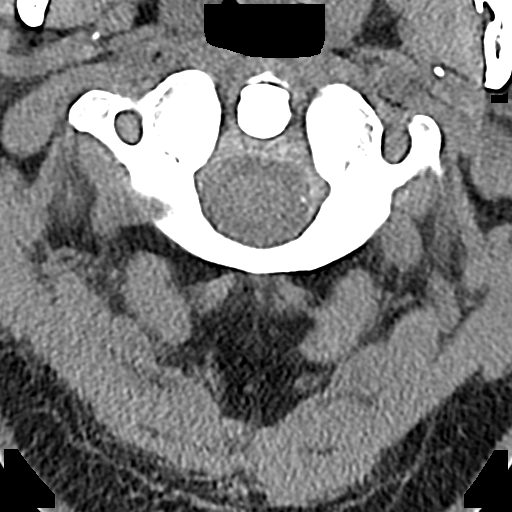
[im 91/107  bone]
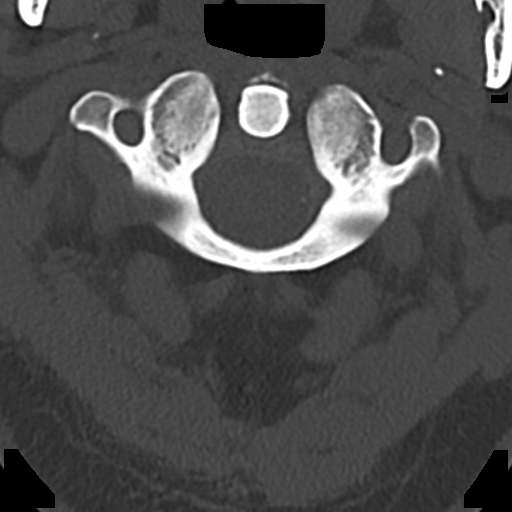

[13 of 33 positions shown; findings below may reference images not displayed]

FINDINGS: Alignment: There is slight reversal of the normal cervical spine
curvature centered at C5. There is no antero or retrolisthesis.
There is no jumped or perched facets or other evidence of traumatic
malalignment.

Skull base and vertebrae: Skull base alignment is maintained.
Vertebral body heights are preserved. There is no evidence of acute
fracture.

Soft tissues and spinal canal: No prevertebral fluid or swelling. No
visible canal hematoma.

Disc levels: There is disc space narrowing with associated
degenerative endplate change most advanced at C5-C6 through C7-T1.
Facet arthropathy is most advanced on the left at C3-C4. There is no
evidence of high-grade spinal canal stenosis.

Upper chest: The lungs are assessed on the separately dictated CTA
chest.

Other: None.
IMPRESSION: No acute fracture or traumatic malalignment of the cervical spine.

## 2023-11-18 IMAGING — CT CT HEAD W/O CM
4 series · 16 of 47 positions shown, 18 images · non-contrast
Comparison: CT head 01/29/2020

CLINICAL DATA: Syncope, neck/back pain



[Series 2: head wo · axial · 0.39mm/px · z∈[-142,-22]mm · 7 of 32 slices shown, 9 images]
[im 4/32  brain]
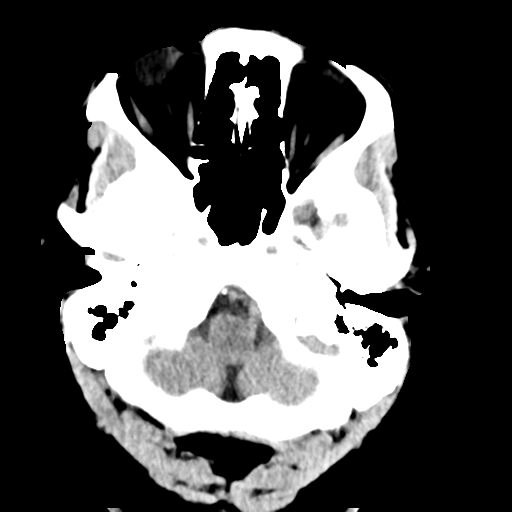
[im 4/32  bone]
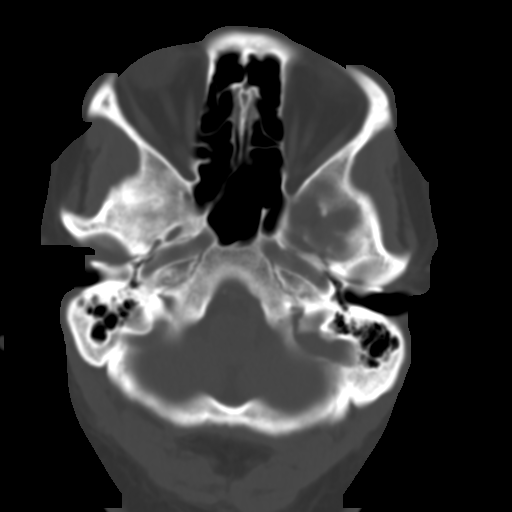
[im 8/32  brain]
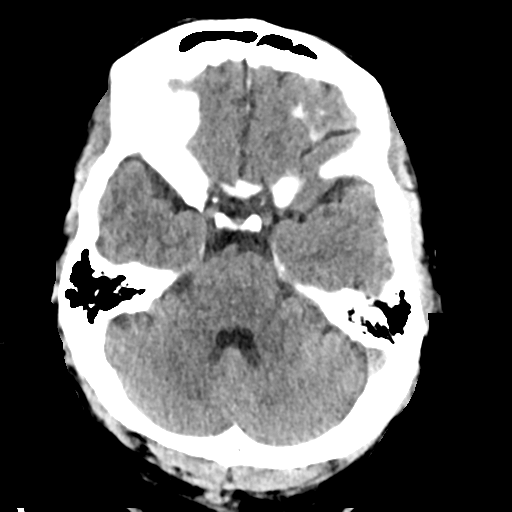
[im 12/32  brain]
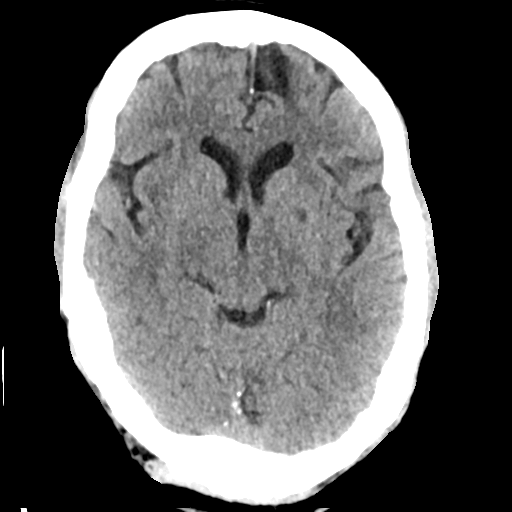
[im 16/32  brain]
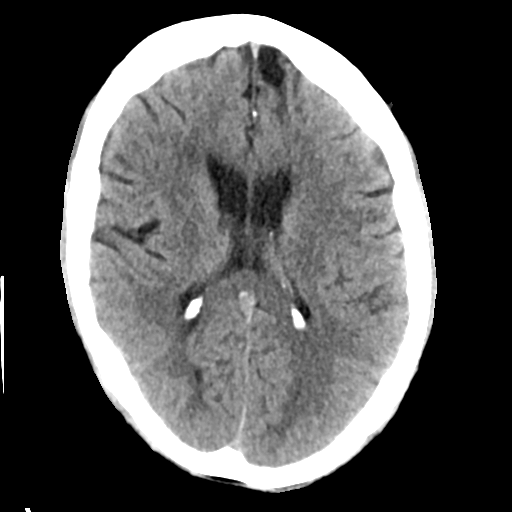
[im 20/32  brain]
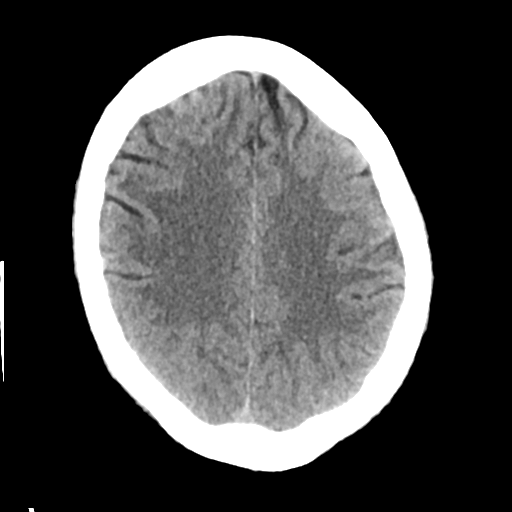
[im 20/32  bone]
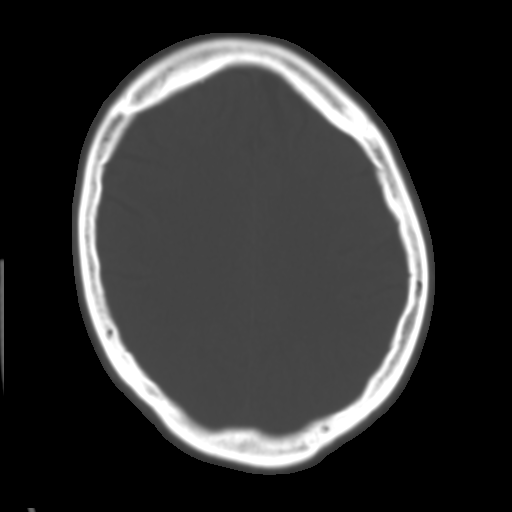
[im 24/32  brain]
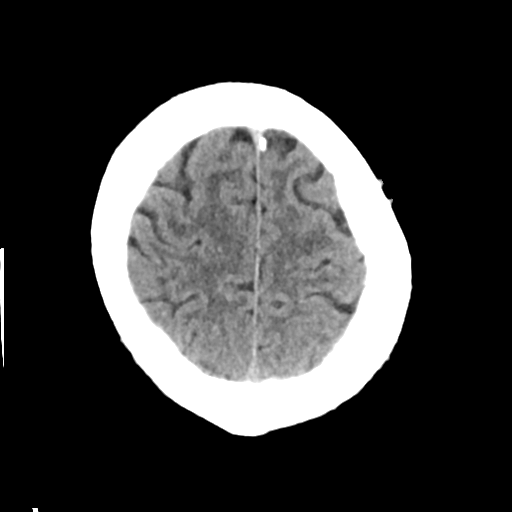
[im 28/32  brain]
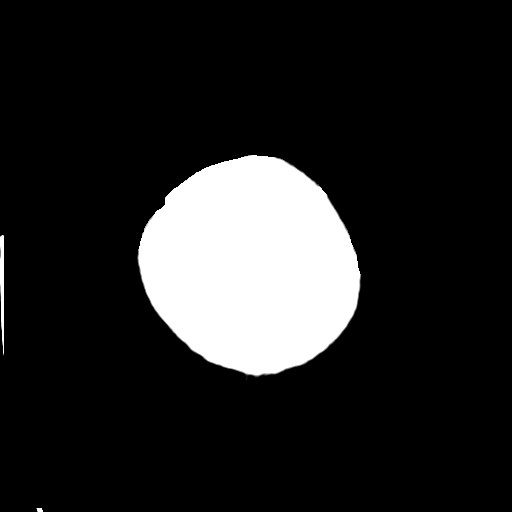

[Series 5: head bone · axial · 0.39mm/px · z∈[-142,-110]mm · 3 of 79 slices shown]
[im 8/79  bone]
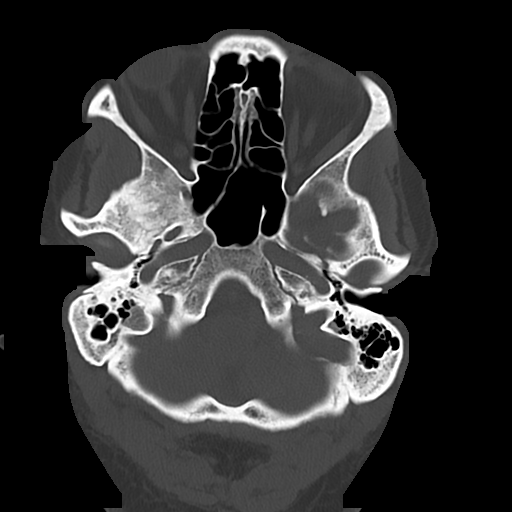
[im 16/79  bone]
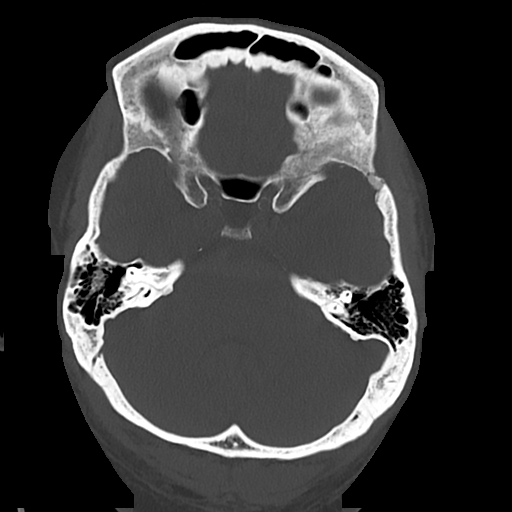
[im 24/79  bone]
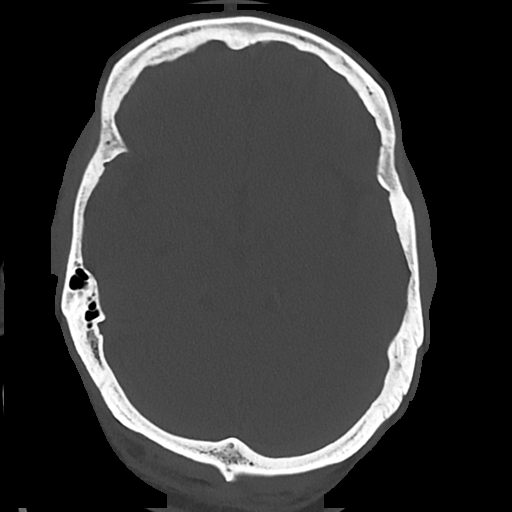

[Series 6: cor soft · coronal · 0.35mm/px · 3 of 70 slices shown]
[im 24/70  brain]
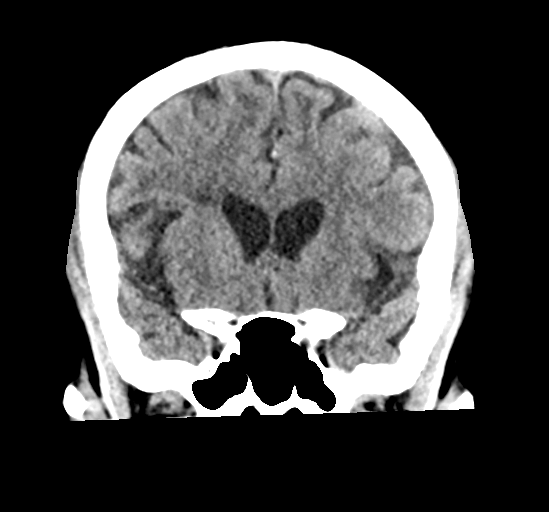
[im 31/70  brain]
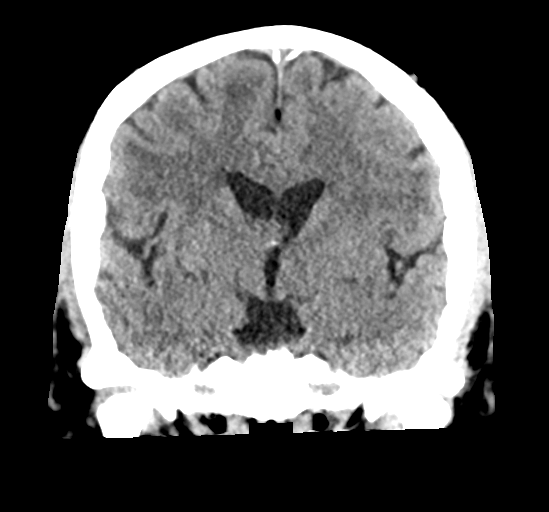
[im 39/70  brain]
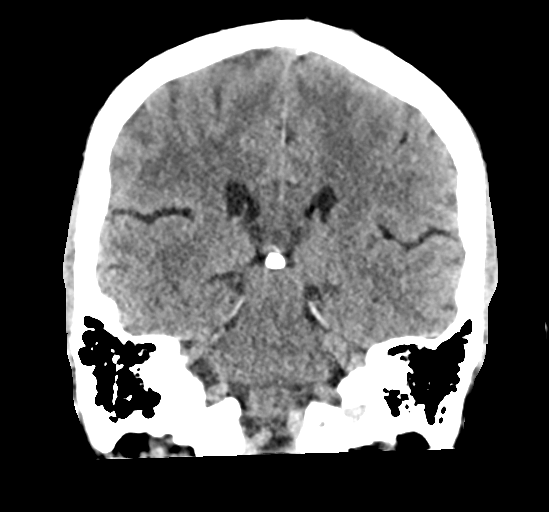

[Series 7: sag soft · sagittal · 0.35mm/px · 3 of 64 slices shown]
[im 22/64  brain]
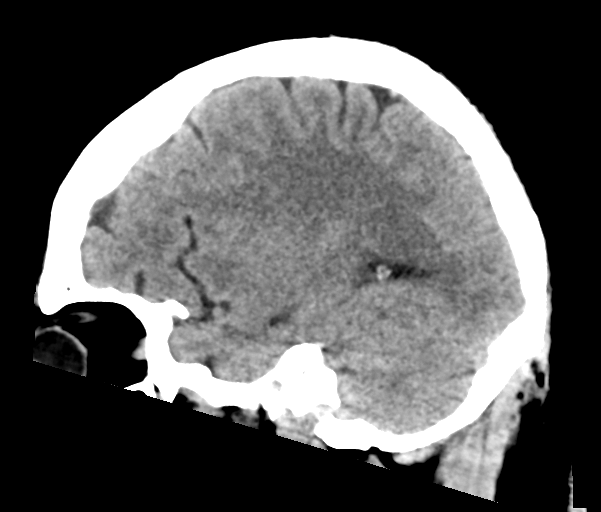
[im 32/64  brain]
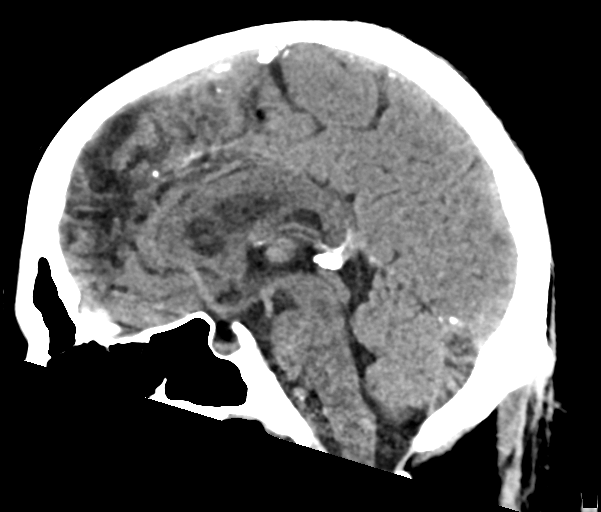
[im 43/64  brain]
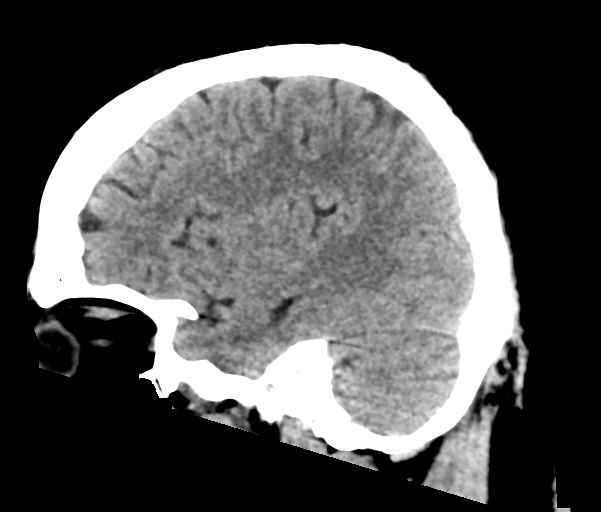

[16 of 47 positions shown; findings below may reference images not displayed]

FINDINGS: Brain: There is no acute intracranial hemorrhage, extra-axial fluid
collection, or acute infarct.

Background parenchymal volume is normal. The ventricles are normal
in size. Encephalomalacia in the left anterior frontal lobe is
unchanged. There is age-indeterminate but remote appearing infarct
in the left caudate head/lentiform nucleus, new since 9397.

There is no mass lesion.  There is no mass effect or midline shift.

Vascular: No hyperdense vessel or unexpected calcification.

Skull: Normal. Negative for fracture or focal lesion.

Sinuses/Orbits: The imaged paranasal sinuses are clear. The imaged
globes and orbits are unremarkable.

Other: None.
IMPRESSION: 1. No acute intracranial pathology.
2. Remote appearing infarct in the left caudate head/lentiform
nucleus is new since 01/29/2020. Encephalomalacia in the left
anterior frontal lobe is unchanged.

## 2023-11-18 IMAGING — CT CT T SPINE W/O CM
2 of 5 series · 9 of 27 positions shown, 10 images · non-contrast
Comparison: CT abdomen pelvis, 03/23/2021, CT chest angiogram,
12/28/2018

CLINICAL DATA: Syncopal episode, back and neck pain, trauma
multiple myeloma * Tracking Code: BO *

EXAM:
CT ANGIOGRAPHY CHEST, ABDOMEN AND PELVIS
CT THORACIC SPINE WITHOUT CONTRAST
TECHNIQUE: Non-contrast CT of the chest was initially obtained.

[Series 5: t no charge thin · axial · 0.29mm/px · z∈[-492,-282]mm · 4 of 316 slices shown, 5 images]
[im 53/316  soft-tissue]
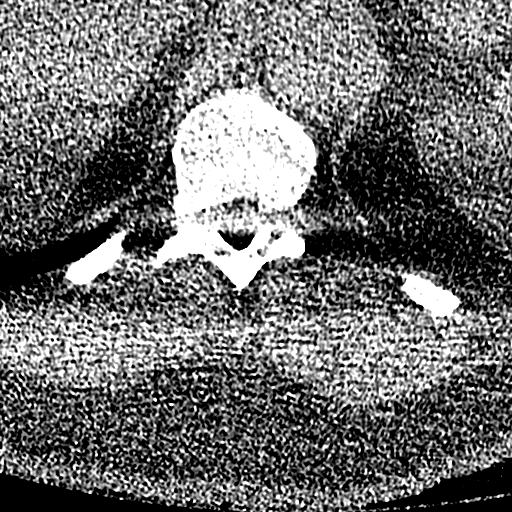
[im 53/316  bone]
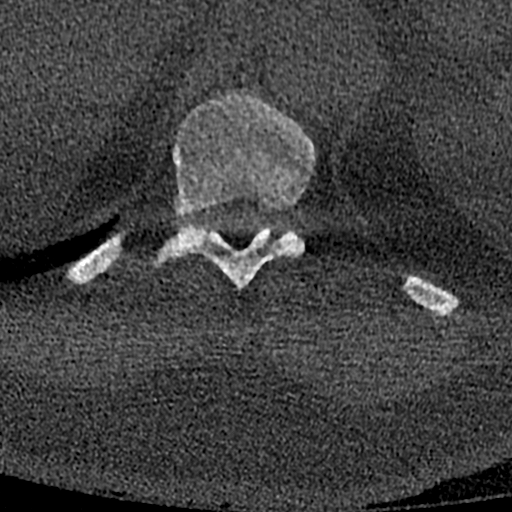
[im 106/316  bone]
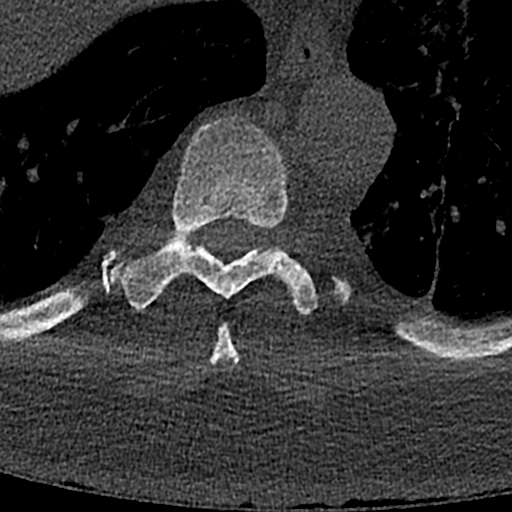
[im 211/316  bone]
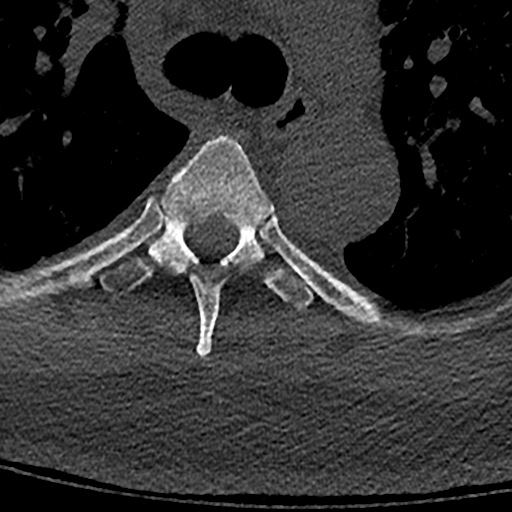
[im 263/316  bone]
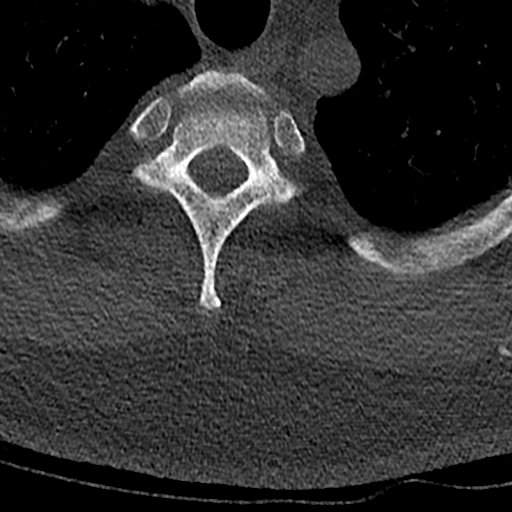

[Series 6: t no charge sag · sagittal · 0.29mm/px · 5 of 149 slices shown]
[im 38/149  bone]
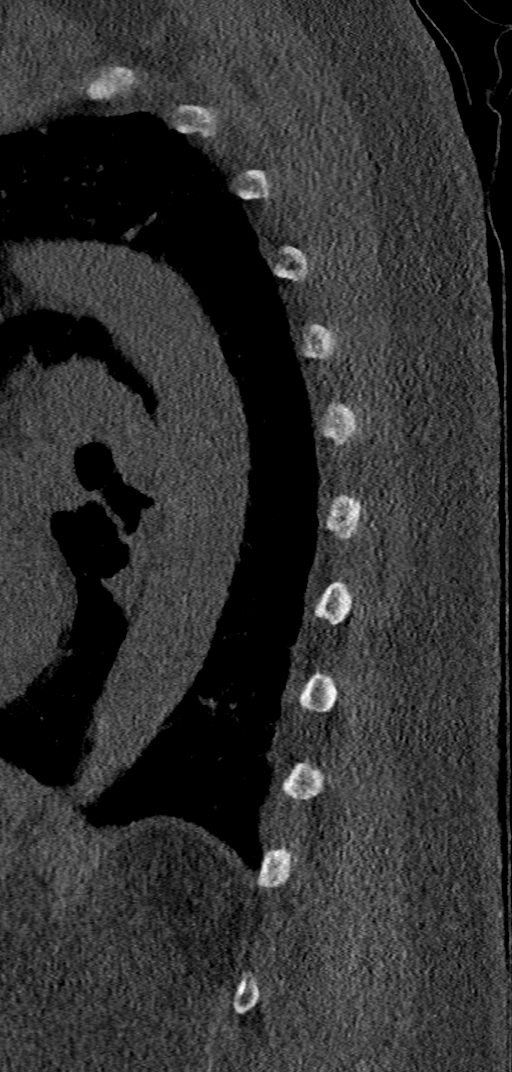
[im 56/149  bone]
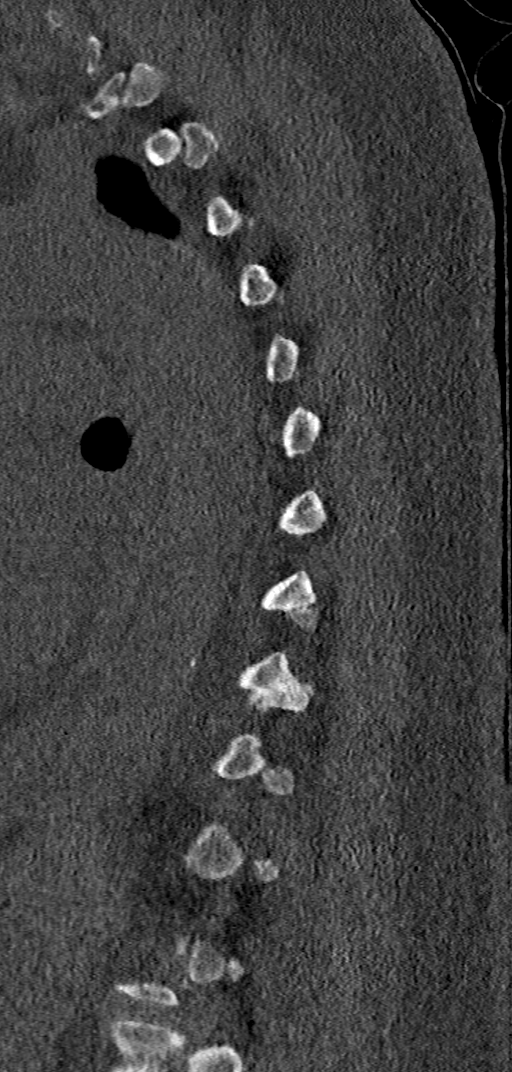
[im 75/149  bone]
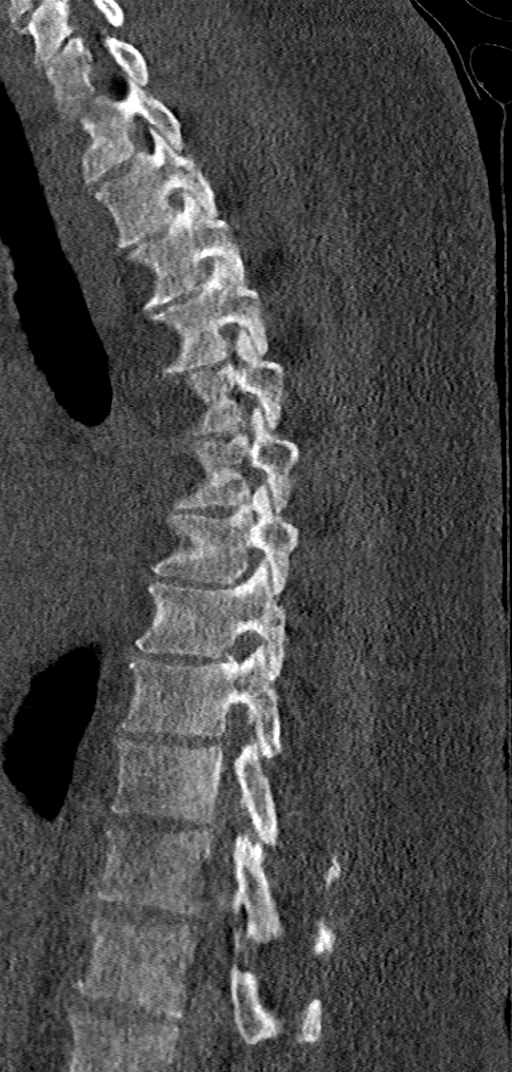
[im 93/149  bone]
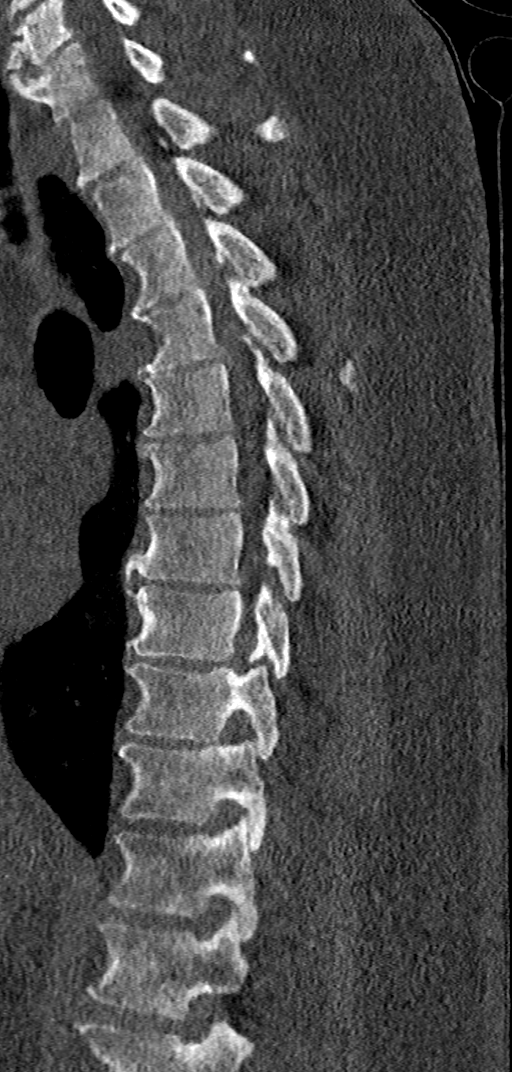
[im 112/149  bone]
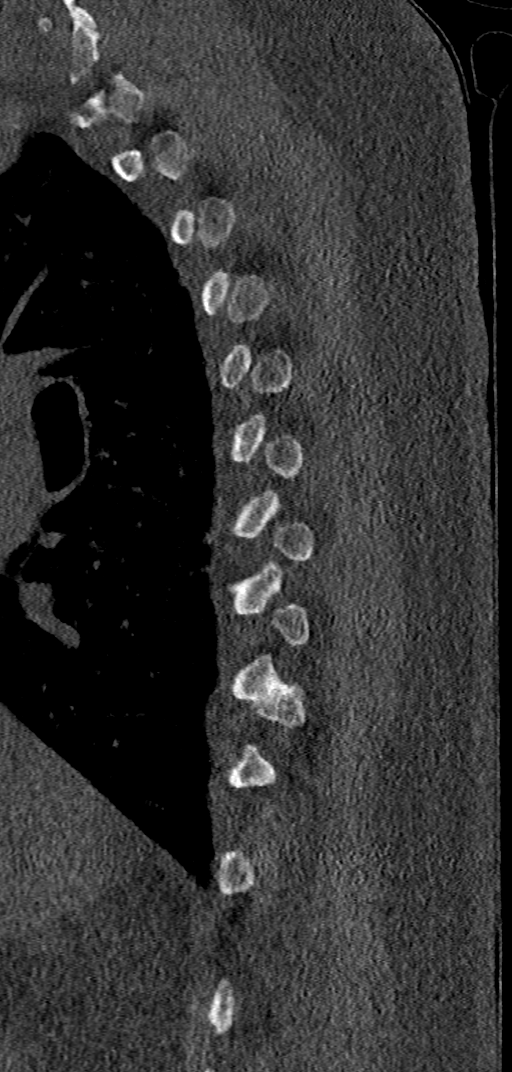

[9 of 27 positions shown; findings below may reference images not displayed]

Multidetector CT imaging through the chest, abdomen and pelvis was
performed using the standard protocol during bolus administration of
intravenous contrast. Multiplanar reconstructed images and MIPs were
obtained and reviewed to evaluate the vascular anatomy.

Multi detector CT imaging of the thoracic spine was performed using
the standard protocol prior to administration of intravenous
contrast.

RADIATION DOSE REDUCTION: This exam was performed according to the
departmental dose-optimization program which includes automated
exposure control, adjustment of the mA and/or kV according to
patient size and/or use of iterative reconstruction technique.

CONTRAST:  100mL OMNIPAQUE IOHEXOL 350 MG/ML SOLN
FINDINGS: CTA CHEST FINDINGS

VASCULAR

Aorta: Satisfactory opacification of the aorta. Normal contour and
caliber of the thoracic aorta. No evidence of aneurysm, dissection,
or other acute aortic pathology.

Cardiovascular: Right chest port catheter. No evidence of pulmonary
embolism on limited non-tailored examination. Normal heart size.
Left coronary artery calcifications. No pericardial effusion.

Review of the MIP images confirms the above findings.

NON VASCULAR

Mediastinum/Nodes: No enlarged mediastinal, hilar, or axillary lymph
nodes. Thyroid gland, trachea, and esophagus demonstrate no
significant findings.

Lungs/Pleura: Extensive fine centrilobular nodularity and
ground-glass throughout the lungs without clear apical to basal
gradient, new compared to prior examination. Bland appearing,
bandlike scarring of the bilateral lung bases. No pleural effusion
or pneumothorax.

Musculoskeletal: No chest wall abnormality. No acute osseous
findings.

Review of the MIP images confirms the above findings.

CTA ABDOMEN AND PELVIS FINDINGS

VASCULAR

Normal contour and caliber of the abdominal aorta. No evidence of
aneurysm, dissection, or other acute aortic pathology. Standard
branching pattern of the abdominal aorta with solitary bilateral
renal arteries. Mild scattered atherosclerosis.

Review of the MIP images confirms the above findings.

NON-VASCULAR

Hepatobiliary: No solid liver abnormality is seen. Hepatic
steatosis. Status post cholecystectomy with unchanged postoperative
parenchymal scarring of the anterior left lobe of the liver (series
10, image 135). No biliary ductal dilatation.

Pancreas: Unremarkable. No pancreatic ductal dilatation or
surrounding inflammatory changes.

Spleen: Normal in size without significant abnormality.

Adrenals/Urinary Tract: Adrenal glands are unremarkable. Kidneys are
normal, without renal calculi, solid lesion, or hydronephrosis.
Bladder is unremarkable.

Stomach/Bowel: Stomach is within normal limits. Appendix appears
normal. No evidence of bowel wall thickening, distention, or
inflammatory changes.

Lymphatic: No enlarged abdominal or pelvic lymph nodes.

Reproductive: No mass or other significant abnormality.

Other: No abdominal wall hernia or abnormality. No ascites.

Musculoskeletal: No acute osseous findings. Diffuse osseous
sclerosis with unchanged lytic lesions of the L2 vertebral body
(series 14, image 66) and the right ischium (series 10, image 307).

CT THORACIC SPINE FINDINGS

Alignment: Normal thoracic kyphosis.

Vertebral bodies: Diffusely sclerotic vertebral bodies. No evidence
of fracture or thoracic spine lytic lesion.

Disc spaces: Mild disc space height loss and osteophytosis
throughout the thoracic spine.

Paraspinous soft tissues: Unremarkable.
IMPRESSION: 1. Normal contour and caliber of the thoracic and abdominal aorta,
without evidence of aneurysm, dissection, or other acute aortic
pathology. Mild, scattered atherosclerosis.
2. No fracture or dislocation of the thoracic spine. Mild multilevel
disc degenerative disease.
3. Extensive fine centrilobular nodularity and ground-glass
throughout the lungs, new compared to prior examination and
consistent with atypical infection or inhalational inflammation such
as hypersensitivity pneumonitis.
4. Diffuse osseous sclerosis with unchanged lytic lesions of the L2
vertebral body and the right ischium, consistent with known
diagnosis of multiple myeloma. No new osseous lesions or evidence of
soft tissue involvement on this arterial phase examination.
5. Hepatic steatosis.
6. Coronary artery disease.

Aortic Atherosclerosis (ET7FS-N5V.V).

## 2023-11-18 IMAGING — CT CT ANGIO CHEST-ABD-PELV FOR DISSECTION W/ AND WO/W CM
2 of 7 series · 11 of 36 positions shown, 15 images · IV contrast (APPLIED)
Comparison: CT abdomen pelvis, 03/23/2021, CT chest angiogram,
12/28/2018

CLINICAL DATA: Syncopal episode, back and neck pain, trauma
multiple myeloma * Tracking Code: BO *

EXAM:
CT ANGIOGRAPHY CHEST, ABDOMEN AND PELVIS
CT THORACIC SPINE WITHOUT CONTRAST
TECHNIQUE: Non-contrast CT of the chest was initially obtained.

[Series 10: arterial · axial · arterial · 0.98mm/px · z∈[-865,-277]mm · 10 of 336 slices shown, 13 images]
[im 21/336  mediastinal]
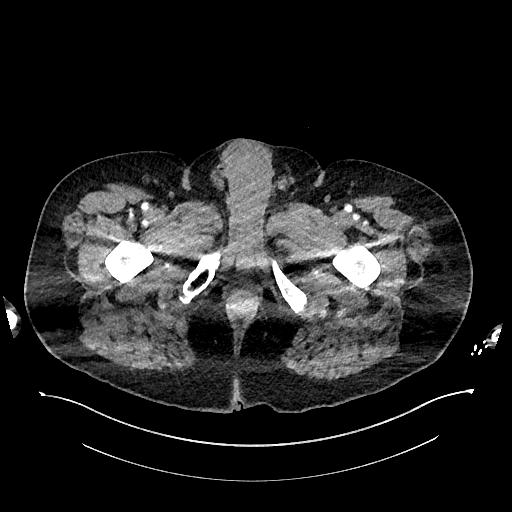
[im 21/336  bone]
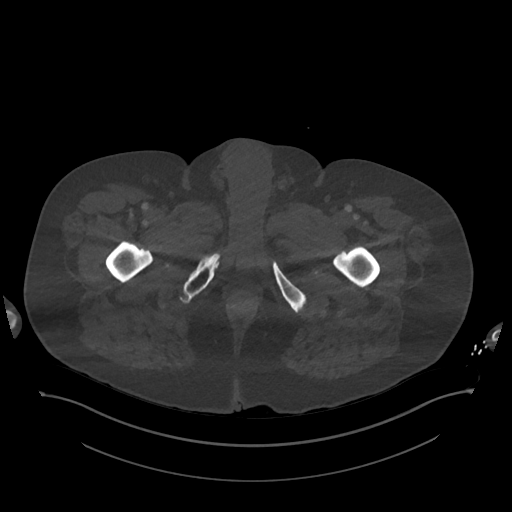
[im 63/336  mediastinal]
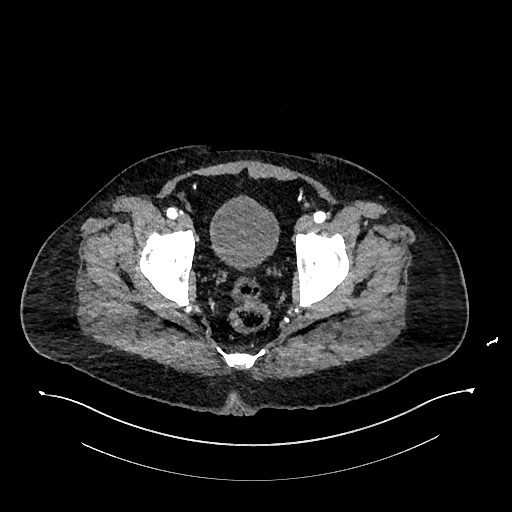
[im 105/336  mediastinal]
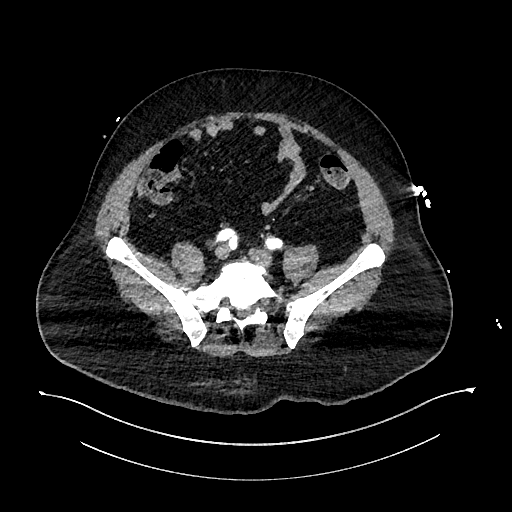
[im 147/336  mediastinal]
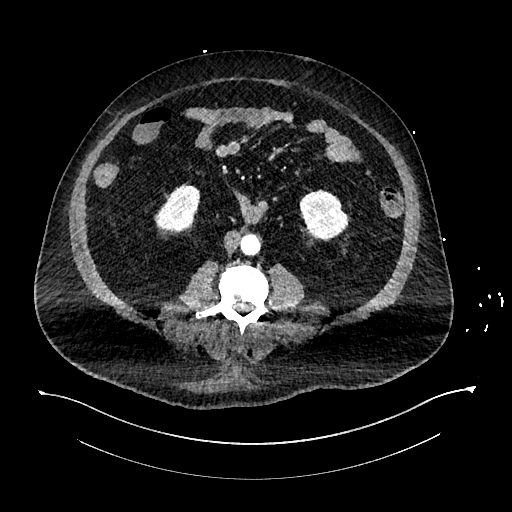
[im 189/336  mediastinal]
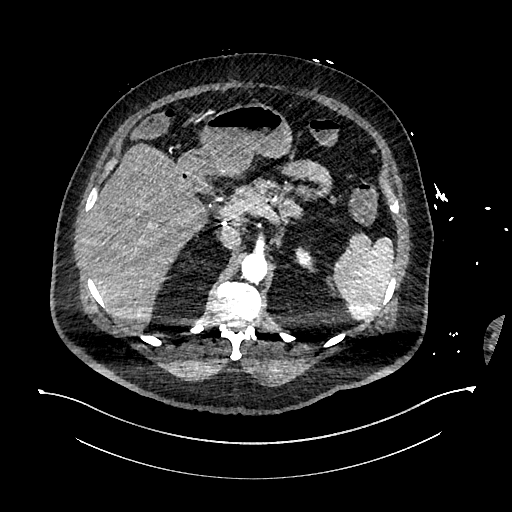
[im 231/336  mediastinal]
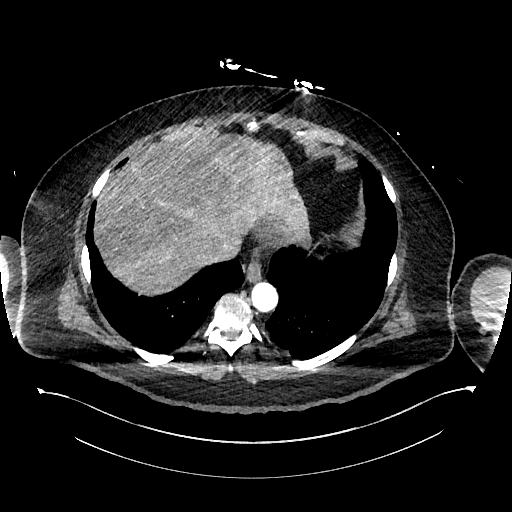
[im 252/336  lung]
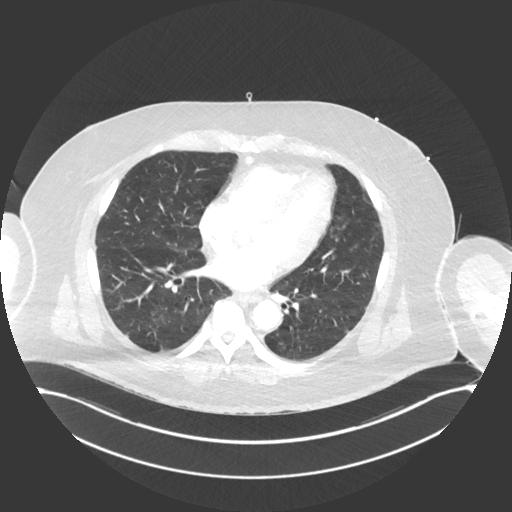
[im 273/336  mediastinal]
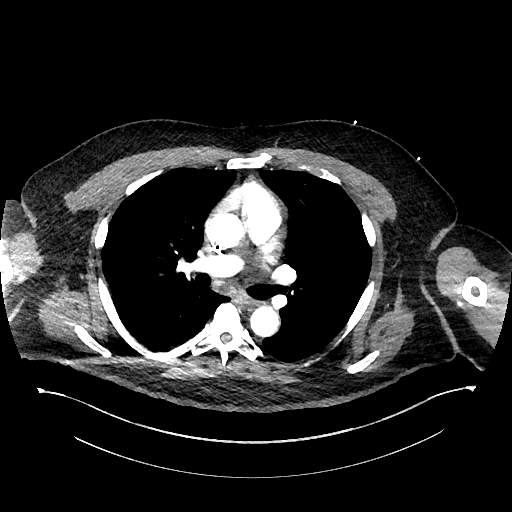
[im 273/336  lung]
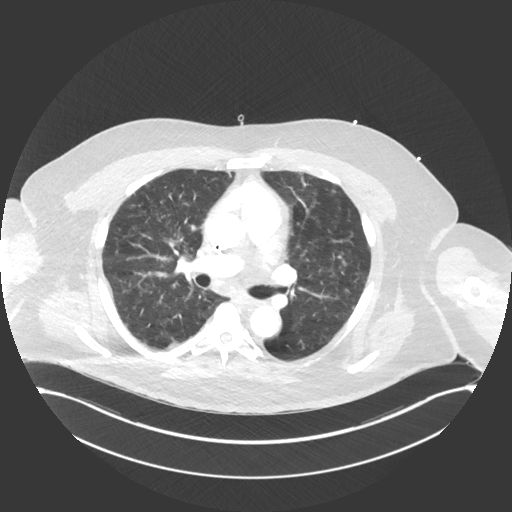
[im 294/336  lung]
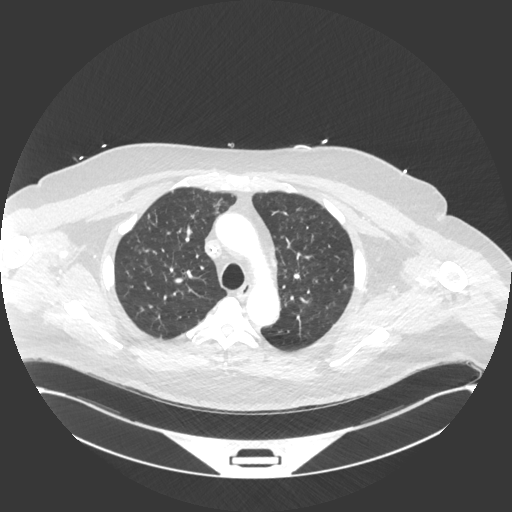
[im 315/336  mediastinal]
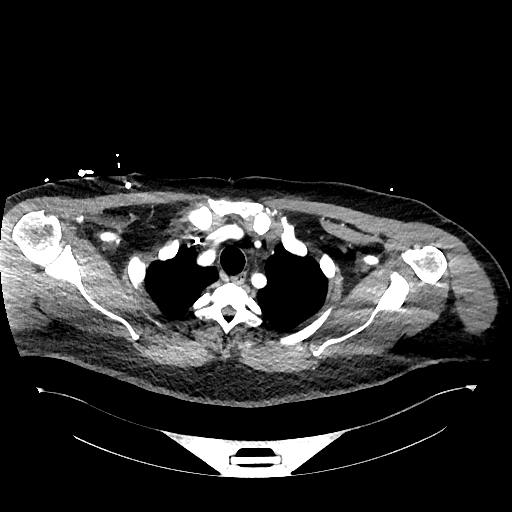
[im 315/336  lung]
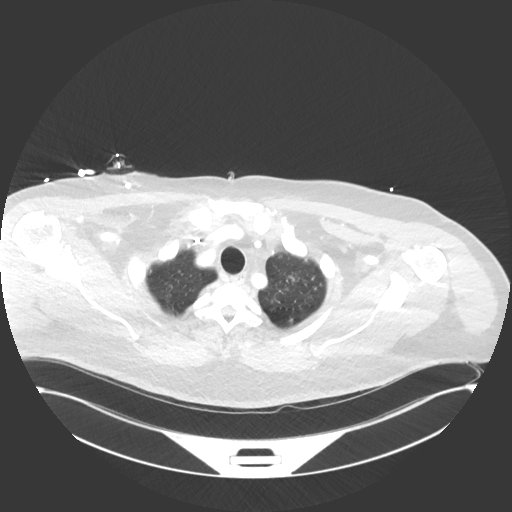

[Series 13: cor · coronal · 0.98mm/px · 1 of 151 slices shown, 2 images]
[im 76/151  mediastinal]
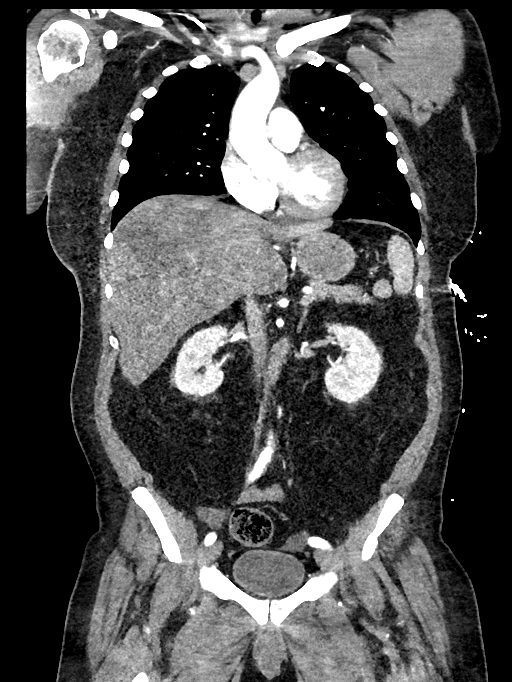
[im 76/151  bone]
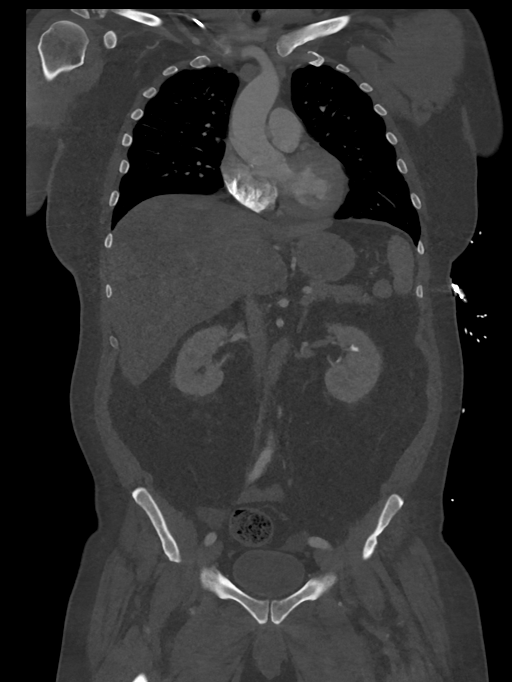

[11 of 36 positions shown; findings below may reference images not displayed]

Multidetector CT imaging through the chest, abdomen and pelvis was
performed using the standard protocol during bolus administration of
intravenous contrast. Multiplanar reconstructed images and MIPs were
obtained and reviewed to evaluate the vascular anatomy.

Multi detector CT imaging of the thoracic spine was performed using
the standard protocol prior to administration of intravenous
contrast.

RADIATION DOSE REDUCTION: This exam was performed according to the
departmental dose-optimization program which includes automated
exposure control, adjustment of the mA and/or kV according to
patient size and/or use of iterative reconstruction technique.

CONTRAST:  100mL OMNIPAQUE IOHEXOL 350 MG/ML SOLN
FINDINGS: CTA CHEST FINDINGS

VASCULAR

Aorta: Satisfactory opacification of the aorta. Normal contour and
caliber of the thoracic aorta. No evidence of aneurysm, dissection,
or other acute aortic pathology.

Cardiovascular: Right chest port catheter. No evidence of pulmonary
embolism on limited non-tailored examination. Normal heart size.
Left coronary artery calcifications. No pericardial effusion.

Review of the MIP images confirms the above findings.

NON VASCULAR

Mediastinum/Nodes: No enlarged mediastinal, hilar, or axillary lymph
nodes. Thyroid gland, trachea, and esophagus demonstrate no
significant findings.

Lungs/Pleura: Extensive fine centrilobular nodularity and
ground-glass throughout the lungs without clear apical to basal
gradient, new compared to prior examination. Bland appearing,
bandlike scarring of the bilateral lung bases. No pleural effusion
or pneumothorax.

Musculoskeletal: No chest wall abnormality. No acute osseous
findings.

Review of the MIP images confirms the above findings.

CTA ABDOMEN AND PELVIS FINDINGS

VASCULAR

Normal contour and caliber of the abdominal aorta. No evidence of
aneurysm, dissection, or other acute aortic pathology. Standard
branching pattern of the abdominal aorta with solitary bilateral
renal arteries. Mild scattered atherosclerosis.

Review of the MIP images confirms the above findings.

NON-VASCULAR

Hepatobiliary: No solid liver abnormality is seen. Hepatic
steatosis. Status post cholecystectomy with unchanged postoperative
parenchymal scarring of the anterior left lobe of the liver (series
10, image 135). No biliary ductal dilatation.

Pancreas: Unremarkable. No pancreatic ductal dilatation or
surrounding inflammatory changes.

Spleen: Normal in size without significant abnormality.

Adrenals/Urinary Tract: Adrenal glands are unremarkable. Kidneys are
normal, without renal calculi, solid lesion, or hydronephrosis.
Bladder is unremarkable.

Stomach/Bowel: Stomach is within normal limits. Appendix appears
normal. No evidence of bowel wall thickening, distention, or
inflammatory changes.

Lymphatic: No enlarged abdominal or pelvic lymph nodes.

Reproductive: No mass or other significant abnormality.

Other: No abdominal wall hernia or abnormality. No ascites.

Musculoskeletal: No acute osseous findings. Diffuse osseous
sclerosis with unchanged lytic lesions of the L2 vertebral body
(series 14, image 66) and the right ischium (series 10, image 307).

CT THORACIC SPINE FINDINGS

Alignment: Normal thoracic kyphosis.

Vertebral bodies: Diffusely sclerotic vertebral bodies. No evidence
of fracture or thoracic spine lytic lesion.

Disc spaces: Mild disc space height loss and osteophytosis
throughout the thoracic spine.

Paraspinous soft tissues: Unremarkable.
IMPRESSION: 1. Normal contour and caliber of the thoracic and abdominal aorta,
without evidence of aneurysm, dissection, or other acute aortic
pathology. Mild, scattered atherosclerosis.
2. No fracture or dislocation of the thoracic spine. Mild multilevel
disc degenerative disease.
3. Extensive fine centrilobular nodularity and ground-glass
throughout the lungs, new compared to prior examination and
consistent with atypical infection or inhalational inflammation such
as hypersensitivity pneumonitis.
4. Diffuse osseous sclerosis with unchanged lytic lesions of the L2
vertebral body and the right ischium, consistent with known
diagnosis of multiple myeloma. No new osseous lesions or evidence of
soft tissue involvement on this arterial phase examination.
5. Hepatic steatosis.
6. Coronary artery disease.

Aortic Atherosclerosis (ET7FS-N5V.V).

## 2023-12-14 ENCOUNTER — Other Ambulatory Visit: Payer: Self-pay

## 2023-12-14 ENCOUNTER — Emergency Department
Admission: EM | Admit: 2023-12-14 | Discharge: 2023-12-14 | Disposition: A | Discharge status: Home / Self Care | Attending: Emergency Medicine | Admitting: Emergency Medicine

## 2023-12-14 ENCOUNTER — Emergency Department

## 2023-12-14 DIAGNOSIS — I10 Essential (primary) hypertension: Secondary | ICD-10-CM | POA: Diagnosis not present

## 2023-12-14 DIAGNOSIS — M545 Low back pain, unspecified: Secondary | ICD-10-CM | POA: Diagnosis present

## 2023-12-14 DIAGNOSIS — E119 Type 2 diabetes mellitus without complications: Secondary | ICD-10-CM | POA: Insufficient documentation

## 2023-12-14 DIAGNOSIS — M9979 Connective tissue and disc stenosis of intervertebral foramina of abdomen and other regions: Secondary | ICD-10-CM

## 2023-12-14 DIAGNOSIS — M9973 Connective tissue and disc stenosis of intervertebral foramina of lumbar region: Secondary | ICD-10-CM | POA: Insufficient documentation

## 2023-12-14 DIAGNOSIS — R202 Paresthesia of skin: Secondary | ICD-10-CM

## 2023-12-14 DIAGNOSIS — Z7901 Long term (current) use of anticoagulants: Secondary | ICD-10-CM | POA: Diagnosis not present

## 2023-12-14 DIAGNOSIS — M79602 Pain in left arm: Secondary | ICD-10-CM | POA: Diagnosis not present

## 2023-12-14 MED ORDER — LIDOCAINE 5 % EX PTCH
1.0000 | MEDICATED_PATCH | CUTANEOUS | 0 refills | Status: AC
Start: 2023-12-14 — End: 2023-12-24

## 2023-12-14 MED ORDER — ACETAMINOPHEN 325 MG PO TABS
650.0000 mg | ORAL_TABLET | Freq: Once | ORAL | Status: AC
  Administered 2023-12-14: 650 mg via ORAL
  Filled 2023-12-14: qty 2

## 2023-12-14 MED ORDER — LIDOCAINE 5 % EX PTCH
1.0000 | MEDICATED_PATCH | CUTANEOUS | Status: DC
  Administered 2023-12-14: 1 via TRANSDERMAL
  Filled 2023-12-14: qty 1

## 2023-12-14 NOTE — Discharge Instructions (Addendum)
 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  Please call Dr. Simonne Dubonnet and make an appointment for a follow-up and lumbar MRI.  Please take acetaminophen  650 mg every 8 hours for pain.  You can apply lidocaine patch every 12 hours.  Please come back to ED or go to your PCP if you have new symptoms or symptoms worsen.

## 2023-12-14 NOTE — ED Notes (Signed)
 See triage notes. Patient c/o lower back pain and left arm pain. Patient gets chemo treatments for myeloma. Patient stated he has been lifting a lot at work lately.

## 2023-12-14 NOTE — ED Provider Notes (Signed)
 [facility] Provider Note    Event Date/Time   First MD Initiated Contact with Patient 12/14/23 1627     (approximate)   History   Back Pain    HPI  Kevin Lowery is a 63 y.o. male    with a past medical history of diabetes type 2, multiple myeloma, heart palpitations, left arm pain, left olecranon enthesophyte, chronic midline low back pain with bilateral sciatica, hypertension, with no significant past medical history who presents to the ED complaining of left arm pain and lower back pain. According to the patient, left arm pain symptoms started 3 months ago, he had x-ray , reporting left olecranon enthesophyte, patient denies chest pain, jaw pain.  Patient states pain comes and goes, pain is triggered by cold, patient has numbness in the lateral anterior area of the forearm and hand.  Patient states having lower back pain, denies fecal incontinence, urinary incontinence or urinary retention.  Patient denies numbness on his legs.  Patient works picking up heavy weight.  Patient is taking Xarelto .       Physical Exam   Triage Vital Signs: ED Triage Vitals  Encounter Vitals Group     BP 12/14/23 1535 109/84     Systolic BP Percentile --      Diastolic BP Percentile --      Pulse Rate 12/14/23 1535 (!) 103     Resp 12/14/23 1535 18     Temp 12/14/23 1535 98 F (36.7 C)     Temp src --      SpO2 12/14/23 1535 100 %     Weight 12/14/23 1534 245 lb (111.1 kg)     Height 12/14/23 1534 5\' 9"  (1.753 m)     Head Circumference --      Peak Flow --      Pain Score 12/14/23 1534 6     Pain Loc --      Pain Education --      Exclude from Growth Chart --     Most recent vital signs: Vitals:   12/14/23 1535  BP: 109/84  Pulse: (!) 103  Resp: 18  Temp: 98 F (36.7 C)  SpO2: 100%     Constitutional: Alert, NAD. Able to speak in complete sentences without cough or dyspnea  Eyes: Conjunctivae are normal.  Hypochromic conjunctiva Head:  Atraumatic. Nose: No congestion/rhinnorhea. Mouth/Throat: Mucous membranes are moist.   Neck: Painless ROM. Supple. No JVD, nodes, thyromegaly  Cardiovascular:   Good peripheral circulation.RRR no murmurs, gallops, rubs  Respiratory: Normal respiratory effort.  No retractions. Clear to auscultation bilaterally without wheezing or crackles  Gastrointestinal: Soft and nontender.  Musculoskeletal:  no deformity Left forearm: Skin is intact no ecchymosis no hematomas, no tenderness to palpation.  Pulses positive.  Sensation is intact, strength 5/5 Lumbar spine: Tender to palpation at the level of L4-L5, tender in paraspinal muscles.  Signs of radiculopathy are negative.  Neurologic:  MAE spontaneously. No gross focal neurologic deficits are appreciated.  Skin:  Skin is warm, dry and intact. No rash noted. Psychiatric: Mood and affect are normal. Speech and behavior are normal.    ED Results / Procedures / Treatments   Labs (all labs ordered are listed, but only abnormal results are displayed) Labs Reviewed - No data to display   EKG     RADIOLOGY I independently reviewed and interpreted imaging and agree with radiologists findings.      PROCEDURES:  Critical Care performed:   Procedures  MEDICATIONS ORDERED IN ED: Medications  lidocaine (LIDODERM) 5 % 1 patch (has no administration in time range)  acetaminophen  (TYLENOL ) tablet 650 mg (650 mg Oral Given 12/14/23 1705)   Clinical Course as of 12/14/23 1724  Mon Dec 14, 2023  1700 DG Lumbar Spine 2-3 Views No acute fracture or malalignment of the lumbar spine. 2. Moderate multilevel degenerative disc disease of the lumbar spine with neural foraminal stenosis, likely moderate, at L4-L5 and L5-S1. Correlation for myelopathic symptoms recommended. A nonemergent lumbar spine MRI may be of benefit for further characterization.   [AE]  1701 No acute fracture or dislocation. [AE]  1707 Updated patient with results of x-ray,  I will refer patient to orthopedics for a follow-up and MR lumbar MRI [AE]    Clinical Course User Index [AE] Awilda Lennox, PA-C    IMPRESSION / MDM / ASSESSMENT AND PLAN / ED COURSE  I reviewed the triage vital signs and the nursing notes.  Differential diagnosis includes, but is not limited to, fracture, discal hernia,  Patient's presentation is most consistent with acute complicated illness / injury requiring diagnostic workup.   Patient is a 63 year old male who presents today in ED for symptoms of left arm pain and lower back pain.  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 I independently reviewed and interpreted imaging and agree with radiologists findings. . I did review the patient's allergies and medications.The patient is in stable and satisfactory condition for discharge home  Patient will be discharged home with prescriptions for , lidocaine patch.  Patient states he has acetaminophen  650 mg at home, I advised to take 1 tablet every 8 hours for pain . Patient is to follow up with orthopedics as needed or otherwise directed. Patient is given ED precautions to return to the ED for any worsening or new symptoms. Discussed plan of care with patient, answered all of patient's questions, Patient agreeable to plan of care. Advised patient to take medications according to the instructions on the label. Discussed possible side effects of new medications. Patient verbalized understanding.    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   Awilda Lennox, PA-C 12/14/23 1725    Claria Crofts, MD 12/14/23 [phone]

## 2023-12-14 NOTE — ED Triage Notes (Addendum)
 Pt comes with all over back pain and left arm pain. Pt states the back pain started two weeks ago and has gotten worse. Pt states hx of multiple myeloma and still gets treatments. Of chem. Last treatment was last week.  Pt states left arm pain on and off for three months. Pt states no known injuries. Pt states he has been doing some heavy lifting at work.

## 2024-01-09 ENCOUNTER — Other Ambulatory Visit: Payer: Self-pay

## 2024-01-09 ENCOUNTER — Emergency Department (HOSPITAL_COMMUNITY)
Admission: EM | Admit: 2024-01-09 | Discharge: 2024-01-09 | Disposition: A | Discharge status: Home / Self Care | Attending: Emergency Medicine | Admitting: Emergency Medicine

## 2024-01-09 ENCOUNTER — Emergency Department (HOSPITAL_COMMUNITY)

## 2024-01-09 ENCOUNTER — Encounter (HOSPITAL_COMMUNITY): Payer: Self-pay | Admitting: *Deleted

## 2024-01-09 ENCOUNTER — Other Ambulatory Visit (HOSPITAL_COMMUNITY): Payer: Self-pay

## 2024-01-09 DIAGNOSIS — I82452 Acute embolism and thrombosis of left peroneal vein: Secondary | ICD-10-CM | POA: Diagnosis not present

## 2024-01-09 DIAGNOSIS — M25562 Pain in left knee: Secondary | ICD-10-CM | POA: Insufficient documentation

## 2024-01-09 DIAGNOSIS — M79605 Pain in left leg: Secondary | ICD-10-CM | POA: Diagnosis present

## 2024-01-09 DIAGNOSIS — M79662 Pain in left lower leg: Secondary | ICD-10-CM

## 2024-01-09 LAB — URINALYSIS, ROUTINE W REFLEX MICROSCOPIC
Bacteria, UA: NONE SEEN
Bilirubin Urine: NEGATIVE
Glucose, UA: >=500 mg/dL — AB
Hgb urine dipstick: NEGATIVE
Ketones, ur: NEGATIVE mg/dL
Leukocytes,Ua: NEGATIVE
Nitrite: NEGATIVE
Protein, ur: NEGATIVE mg/dL
Specific Gravity, Urine: 1.022 (ref 1.005–1.030)
pH: 5 (ref 5.0–8.0)

## 2024-01-09 LAB — PROTIME-INR
INR: 1.3 — ABNORMAL HIGH (ref 0.8–1.2)
Prothrombin Time: 17.3 s — ABNORMAL HIGH (ref 11.4–15.2)

## 2024-01-09 LAB — COMPREHENSIVE METABOLIC PANEL WITH GFR
ALT: 21 U/L (ref 0–44)
AST: 21 U/L (ref 15–41)
Albumin: 3.9 g/dL (ref 3.5–5.0)
Alkaline Phosphatase: 60 U/L (ref 38–126)
Anion gap: 10 (ref 5–15)
BUN: 19 mg/dL (ref 8–23)
CO2: 21 mmol/L — ABNORMAL LOW (ref 22–32)
Calcium: 9.6 mg/dL (ref 8.9–10.3)
Chloride: 108 mmol/L (ref 98–111)
Creatinine, Ser: 1.41 mg/dL — ABNORMAL HIGH (ref 0.61–1.24)
GFR, Estimated: 56 mL/min — ABNORMAL LOW (ref >60)
Glucose, Bld: 80 mg/dL (ref 70–99)
Potassium: 3.9 mmol/L (ref 3.5–5.1)
Sodium: 139 mmol/L (ref 135–145)
Total Bilirubin: 0.9 mg/dL (ref 0.0–1.2)
Total Protein: 6.7 g/dL (ref 6.5–8.1)

## 2024-01-09 LAB — CBC
HCT: 46.1 % (ref 39.0–52.0)
Hemoglobin: 14.4 g/dL (ref 13.0–17.0)
MCH: 27.2 pg (ref 26.0–34.0)
MCHC: 31.2 g/dL (ref 30.0–36.0)
MCV: 87.1 fL (ref 80.0–100.0)
Platelets: 221 10*3/uL (ref 150–400)
RBC: 5.29 MIL/uL (ref 4.22–5.81)
RDW: 19.2 % — ABNORMAL HIGH (ref 11.5–15.5)
WBC: 4.3 10*3/uL (ref 4.0–10.5)
nRBC: 0.5 % — ABNORMAL HIGH (ref 0.0–0.2)

## 2024-01-09 LAB — LIPASE, BLOOD: Lipase: 37 U/L (ref 11–51)

## 2024-01-09 MED ORDER — ACETAMINOPHEN 325 MG PO TABS
650.0000 mg | ORAL_TABLET | Freq: Once | ORAL | Status: AC
  Administered 2024-01-09: 650 mg via ORAL
  Filled 2024-01-09: qty 2

## 2024-01-09 MED ORDER — KETOROLAC TROMETHAMINE 15 MG/ML IJ SOLN
15.0000 mg | Freq: Once | INTRAMUSCULAR | Status: AC
  Administered 2024-01-09: 15 mg via INTRAMUSCULAR
  Filled 2024-01-09: qty 1

## 2024-01-09 MED ORDER — DICLOFENAC SODIUM 1 % EX GEL
4.0000 g | Freq: Four times a day (QID) | CUTANEOUS | 0 refills | Status: AC
  Filled 2024-01-09: qty 100, 6d supply, fill #0

## 2024-01-09 MED ORDER — RIVAROXABAN (XARELTO) VTE STARTER PACK (15 & 20 MG)
ORAL_TABLET | ORAL | 0 refills | Status: AC
  Filled 2024-01-09: qty 51, 30d supply, fill #0

## 2024-01-09 NOTE — ED Triage Notes (Signed)
 The pt  is c/o  of a pain behind his lt knee and up into his lt thigh for 2 weeks  he reports that he has had a clot in the lt thigh in the past  he  has a hx of multiple myeloma for  4 years  he gets chemotherapy 2 times a month   and he  is on zarelto

## 2024-01-09 NOTE — Progress Notes (Signed)
 VASCULAR LAB    Left lower extremity venous duplex has been performed.  See CV proc for preliminary results.   Relayed results to Dr. Emil via secure chat  RACHEL, [facility], RVT 01/09/2024, 10:05 AM

## 2024-01-09 NOTE — ED Provider Triage Note (Signed)
 Emergency Medicine Provider Triage Evaluation Note  Kevin Lowery , a 62 y.o. male  was evaluated in triage.  Pt complains of left thigh pain.  He endorses vigorous activity which was approximately 3 weeks ago but pain did not start until today.  He knows of no injury to the area.  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  Denies shortness of breath, chest pain, other systemic complaints.  Review of Systems  Positive:  Negative:   Physical Exam  BP 123/85 (BP Location: Left Arm)   Pulse (!) 102   Temp 98.3 F (36.8 C)   Resp 16   Ht 5' 9 (1.753 m)   Wt 111.1 kg   SpO2 100%   BMI 36.17 kg/m  Gen:   Awake, no distress   Resp:  Normal effort  MSK:   Moves extremities without difficulty  Other:    Medical Decision Making  Medically screening exam initiated at 2:24 AM.  Appropriate orders placed.  Kevin Lowery was informed that the remainder of the evaluation will be completed by another provider, this initial triage assessment does not replace that evaluation, and the importance of remaining in the ED until their evaluation is complete.     Logan Ubaldo NOVAK, PA-C 01/09/24 0225

## 2024-01-09 NOTE — ED Triage Notes (Signed)
 Pts acuity  lowered  to a 3 suggestion  from larry pa

## 2024-01-09 NOTE — Discharge Instructions (Signed)
 Please call your oncologist on Monday and let them know about your issue.  Remember to take Xarelto  with food.  Return for chest pain, sob, if you feel like you are going to pass out.   Use the gel as prescribed. Also take tylenol  1000mg (2 extra strength) four times a day.

## 2024-01-09 NOTE — ED Provider Notes (Signed)
 Kevin Lowery EMERGENCY DEPARTMENT AT [facility] Provider Note   CSN: [id number] Arrival date & time: 01/09/24  0030     Patient presents with: Knee Pain   [name] is a 63 y.o. male.   63 yo M with a chief complaints of left leg pain.'s been going on for couple days.  Mostly on the medial aspect of the leg just above the knee.  He denies any trauma to the area.  Feels like its minimally swollen.  Has a history of a DVT.  Is on Xarelto .  Denies any missed doses.  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  Here for evaluation.   Knee Pain      Prior to Admission medications   Medication Sig Start Date End Date Taking? Authorizing Provider  diclofenac Sodium (VOLTAREN) 1 % GEL Apply 4 g topically 4 (four) times daily. 01/09/24  Yes Emil Share, DO  RIVAROXABAN  (XARELTO ) VTE STARTER PACK (15 & 20 MG) Follow package directions: Take one 15mg  tablet by mouth twice a day. On day 22, switch to one 20mg  tablet once a day. Take with food. 01/09/24  Yes Emil Share, DO  allopurinol  (ZYLOPRIM ) 300 MG tablet Take 450 mg by mouth daily. 08/17/17   [provider]  carvedilol (COREG) 25 MG tablet Take 1.5 tablets by mouth in the morning and at bedtime. 12/28/20   [provider]  cyanocobalamin  1000 MCG tablet Take 1,000 mcg by mouth daily.    [provider]  DULoxetine  (CYMBALTA ) 60 MG capsule Take 60 mg by mouth daily. 01/28/21   [provider]  ENTRESTO 49-51 MG Take 1 tablet by mouth 2 (two) times daily. 01/21/21   [provider]  furosemide (LASIX) 20 MG tablet Take 20 mg by mouth daily. 01/21/21   [provider]  glipiZIDE  (GLUCOTROL ) 5 MG tablet Take 1 tablet (5 mg total) by mouth 2 (two) times daily before a meal. 12/24/21   Jens Durand, MD  guaiFENesin  (MUCINEX ) 600 MG 12 hr tablet Take 1 tablet (600 mg total) by mouth 2 (two) times daily as needed for cough. 07/13/22   Jacolyn Pae, MD  JARDIANCE 10 MG TABS  tablet Take 10 mg by mouth daily. 02/22/21   [provider]  metFORMIN  (GLUCOPHAGE -XR) 750 MG 24 hr tablet Take 1 tablet (750 mg total) by mouth daily with breakfast. 12/25/21   Jens Durand, MD  pantoprazole  (PROTONIX ) 40 MG tablet Take 40 mg by mouth daily. 01/31/21   [provider]  POMALYST 3 MG capsule Take by mouth. 02/19/21   [provider]  potassium chloride SA (KLOR-CON) 20 MEQ tablet Take 20 mEq by mouth 2 (two) times daily. 12/30/20   [provider]  pregabalin  (LYRICA ) 150 MG capsule Take 150 mg by mouth 3 (three) times daily. 03/11/21   [provider]  pseudoephedrine  (SUDAFED) 60 MG tablet Take 1 tablet (60 mg total) by mouth every 6 (six) hours as needed for congestion. 07/13/22   Siadecki, Sebastian, MD  spironolactone (ALDACTONE) 25 MG tablet Take 25 mg by mouth daily. 01/18/21   [provider]  XARELTO  20 MG TABS tablet Take 20 mg by mouth daily with supper. 01/23/21   [provider]    Allergies: Hydrocodone and Oxycodone    Review of Systems  Updated Vital Signs BP 127/81 (BP Location: Left Arm)   Pulse 77   Temp 97.7 F (36.5 C) (Oral)   Resp 20  Ht 5' 9 (1.753 m)   Wt 111.1 kg   SpO2 96%   BMI 36.17 kg/m   Physical Exam Vitals and nursing note reviewed.  Constitutional:      Appearance: He is well-developed.  HENT:     Head: Normocephalic and atraumatic.   Eyes:     Pupils: Pupils are equal, round, and reactive to light.   Neck:     Vascular: No JVD.   Cardiovascular:     Rate and Rhythm: Normal rate and regular rhythm.     Heart sounds: No murmur heard.    No friction rub. No gallop.  Pulmonary:     Effort: No respiratory distress.     Breath sounds: No wheezing.  Abdominal:     General: There is no distension.     Tenderness: There is no abdominal tenderness. There is no guarding or rebound.   Musculoskeletal:        General: Normal range of motion.     Cervical back: Normal  range of motion and neck supple.     Comments: Patient has pain superior medially to the left knee.  Pulse motor and sensation are intact distally.  No appreciable edema.  No obvious ligamentous laxity.  Pain with range of motion.  Difficult to assess McMurray's.   Skin:    Coloration: Skin is not pale.     Findings: No rash.   Neurological:     Mental Status: He is alert and oriented to person, place, and time.   Psychiatric:        Behavior: Behavior normal.     (all labs ordered are listed, but only abnormal results are displayed) Labs Reviewed  COMPREHENSIVE METABOLIC PANEL WITH GFR - Abnormal; Notable for the following components:      Result Value   CO2 21 (*)    Creatinine, Ser 1.41 (*)    GFR, Estimated 56 (*)    All other components within normal limits  CBC - Abnormal; Notable for the following components:   RDW 19.2 (*)    nRBC 0.5 (*)    All other components within normal limits  URINALYSIS, ROUTINE W REFLEX MICROSCOPIC - Abnormal; Notable for the following components:   Glucose, UA >=500 (*)    All other components within normal limits  PROTIME-INR - Abnormal; Notable for the following components:   Prothrombin Time 17.3 (*)    INR 1.3 (*)    All other components within normal limits  LIPASE, BLOOD    EKG: None  Radiology: VAS US  LOWER EXTREMITY VENOUS (DVT) (ONLY [facility] & [facility]) Result Date: 01/09/2024  Lower Venous DVT Study Patient Name:  Kevin Lowery  Date of Exam:   01/09/2024 Medical Rec #: [id number]      Accession #:    [id number] Date of Birth: [birth year]     Patient Gender: M Patient Age:   54 years Exam Location:  [facility] Procedure:      VAS US  LOWER EXTREMITY VENOUS (DVT) Referring Phys: Kamoni Depree --------------------------------------------------------------------------------  Indications: Pain.  Risk Factors: Multiple Myeloma without remission. Has chemo treatments bimonthly at [facility]. 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 Compliant with anticoagulation. Anticoagulation: Xarelto . Comparison Study: Prior negative bilateral LEV done at [facility] 11/23/2019 Performing Technologist: Alberta Lis RVS  Examination Guidelines: A complete evaluation includes B-mode imaging, spectral Doppler, color Doppler, and power Doppler as needed of all accessible portions of each vessel. Bilateral testing is considered an integral  part of a complete examination. Limited examinations for reoccurring indications may be performed as noted. The reflux portion of the exam is performed with the patient in reverse Trendelenburg.  +-------+---------------+---------+-----------+----------+--------------+ RIGHT  CompressibilityPhasicitySpontaneityPropertiesThrombus Aging +-------+---------------+---------+-----------+----------+--------------+ CFV    Full           Yes      Yes                                 +-------+---------------+---------+-----------+----------+--------------+ SFJ    Full                                                        +-------+---------------+---------+-----------+----------+--------------+ FV ProxFull                                                        +-------+---------------+---------+-----------+----------+--------------+ PFV    Full                                                        +-------+---------------+---------+-----------+----------+--------------+   +---------+---------------+---------+-----------+----------+--------------+ LEFT     CompressibilityPhasicitySpontaneityPropertiesThrombus Aging +---------+---------------+---------+-----------+----------+--------------+ CFV      Full           Yes      Yes                                 +---------+---------------+---------+-----------+----------+--------------+ SFJ      Full                                                        +---------+---------------+---------+-----------+----------+--------------+  FV Prox  Full           Yes      Yes                                 +---------+---------------+---------+-----------+----------+--------------+ FV Mid   Full           Yes      Yes                                 +---------+---------------+---------+-----------+----------+--------------+ FV DistalFull           Yes      Yes                                 +---------+---------------+---------+-----------+----------+--------------+ PFV      Full           Yes      Yes                                 +---------+---------------+---------+-----------+----------+--------------+  POP      Full           Yes      Yes                                 +---------+---------------+---------+-----------+----------+--------------+ PTV      Partial                                      Acute          +---------+---------------+---------+-----------+----------+--------------+ PERO     Partial                                      Acute          +---------+---------------+---------+-----------+----------+--------------+ TP trunk None                                         Acute          +---------+---------------+---------+-----------+----------+--------------+    Summary: RIGHT: - No evidence of common femoral vein obstruction.   LEFT: - Findings consistent with acute deep vein thrombosis involving the Proximal posterior tibial and peroneal veins and at the tibioperoneal trunk.  - No cystic structure found in the popliteal fossa.  *See table(s) above for measurements and observations.    Preliminary    DG Femur Min 2 Views Left Result Date: 01/09/2024 CLINICAL DATA:  Left thigh pain EXAM: LEFT FEMUR 2 VIEWS COMPARISON:  None are available FINDINGS: No acute fracture or dislocation. Mild arthritis of the left hip. 2.7 cm oval sclerotic lesion in the medial left femoral metaphysis with a narrow zone of transition. No associated cortical breakthrough, endosteal scalloping, or  periosteal reaction. IMPRESSION: 1. No acute fracture or dislocation. 2. Indeterminate 2.7 cm fibroosseous lesion in the distal femoral metaphysis is favored benign. Electronically Signed   By: Norman Gatlin M.D.   On: 01/09/2024 03:09     Procedures   Medications Ordered in the ED  acetaminophen  (TYLENOL ) tablet 650 mg (650 mg Oral Given 01/09/24 0153)  acetaminophen  (TYLENOL ) tablet 650 mg (650 mg Oral Given 01/09/24 0849)  ketorolac (TORADOL) 15 MG/ML injection 15 mg (15 mg Intramuscular Given 01/09/24 0849)                                    Medical Decision Making Risk OTC drugs. Prescription drug management.   63 yo M with a chief complaints of left leg pain.  Likely this is a muscular strain by history and physical exam.  He has a history of a DVT in the past.  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  Will obtain a DVT study.  Plain film of the left knee independently interpreted by me without fracture.  No anemia.  No leukocytosis.  Renal function at baseline.  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  I discussed case with our clinical pharmacist who saw the patient in person.  Patient has been taking the medication on an empty stomach.  Thought to be reasonable to start him  back on 15 mg twice a day for 21 days and then go back to 20 mg at night.  Oncology follow-up.  10:42 AM:  I have discussed the diagnosis/risks/treatment options with the patient.  Evaluation and diagnostic testing in the emergency department does not suggest an emergent condition requiring admission or immediate intervention beyond what has been performed at this time.  They will follow up with Oncology. We also discussed returning to the ED immediately if new or worsening sx occur. We discussed the sx which are most concerning (e.g., sudden worsening pain, fever, inability to tolerate by mouth) that necessitate immediate return. Medications administered to  the patient during their visit and any new prescriptions provided to the patient are listed below.  Medications given during this visit Medications  acetaminophen  (TYLENOL ) tablet 650 mg (650 mg Oral Given 01/09/24 0153)  acetaminophen  (TYLENOL ) tablet 650 mg (650 mg Oral Given 01/09/24 0849)  ketorolac (TORADOL) 15 MG/ML injection 15 mg (15 mg Intramuscular Given 01/09/24 0849)     The patient appears reasonably screen and/or stabilized for discharge and I doubt any other medical condition or other [facility] requiring further screening, evaluation, or treatment in the ED at this time prior to discharge.       Final diagnoses:  Acute deep vein thrombosis (DVT) of left peroneal vein (HCC)  Acute pain of left knee    ED Discharge Orders          Ordered    RIVAROXABAN  (XARELTO ) VTE STARTER PACK (15 & 20 MG)        01/09/24 1031    diclofenac Sodium (VOLTAREN) 1 % GEL  4 times daily        01/09/24 1032               Elizabeth, DO 01/09/24 1042

## 2024-08-09 ENCOUNTER — Other Ambulatory Visit: Payer: Self-pay

## 2024-08-09 ENCOUNTER — Emergency Department (HOSPITAL_COMMUNITY): Payer: Worker's Compensation

## 2024-08-09 ENCOUNTER — Emergency Department (HOSPITAL_COMMUNITY)
Admission: EM | Admit: 2024-08-09 | Discharge: 2024-08-09 | Disposition: A | Discharge status: Home / Self Care | Payer: Worker's Compensation | Attending: Emergency Medicine | Admitting: Emergency Medicine

## 2024-08-09 ENCOUNTER — Encounter (HOSPITAL_COMMUNITY): Payer: Self-pay

## 2024-08-09 DIAGNOSIS — M79652 Pain in left thigh: Secondary | ICD-10-CM | POA: Diagnosis not present

## 2024-08-09 DIAGNOSIS — Z7984 Long term (current) use of oral hypoglycemic drugs: Secondary | ICD-10-CM | POA: Insufficient documentation

## 2024-08-09 DIAGNOSIS — I110 Hypertensive heart disease with heart failure: Secondary | ICD-10-CM | POA: Diagnosis not present

## 2024-08-09 DIAGNOSIS — Z7901 Long term (current) use of anticoagulants: Secondary | ICD-10-CM | POA: Insufficient documentation

## 2024-08-09 DIAGNOSIS — W19XXXA Unspecified fall, initial encounter: Secondary | ICD-10-CM

## 2024-08-09 DIAGNOSIS — W000XXA Fall on same level due to ice and snow, initial encounter: Secondary | ICD-10-CM | POA: Diagnosis not present

## 2024-08-09 DIAGNOSIS — E119 Type 2 diabetes mellitus without complications: Secondary | ICD-10-CM | POA: Insufficient documentation

## 2024-08-09 DIAGNOSIS — M25562 Pain in left knee: Secondary | ICD-10-CM | POA: Insufficient documentation

## 2024-08-09 DIAGNOSIS — I5022 Chronic systolic (congestive) heart failure: Secondary | ICD-10-CM | POA: Diagnosis not present

## 2024-08-09 MED ORDER — ACETAMINOPHEN 500 MG PO TABS
1000.0000 mg | ORAL_TABLET | Freq: Once | ORAL | Status: AC
  Administered 2024-08-09: 1000 mg via ORAL
  Filled 2024-08-09: qty 2

## 2024-08-09 NOTE — ED Provider Triage Note (Signed)
 Emergency Medicine Provider Triage Evaluation Note  Kevin Lowery , a 64 y.o. male  was evaluated in triage.  Pt complains of fall. Slipped on ice and fell today.  L leg got bent backward.  Endorse pain to L thigh and L knee from the fall.  Did not hit head of LOC.  On blood thinner. Denies hip or ankle pain  Review of Systems  Positive: As above Negative: As above  Physical Exam  BP 103/72 (BP Location: Left Arm)   Pulse (!) 52   Temp (!) 97.2 F (36.2 C) (Oral)   Resp 19   Ht 5' 9 (1.753 m)   Wt 108.9 kg   SpO2 100%   BMI 35.44 kg/m  Gen:   Awake, no distress   Resp:  Normal effort  MSK:   Moves extremities without difficulty  Other:    Medical Decision Making  Medically screening exam initiated at 9:16 PM.  Appropriate orders placed.  Jamone Garrido was informed that the remainder of the evaluation will be completed by another provider, this initial triage assessment does not replace that evaluation, and the importance of remaining in the ED until their evaluation is complete.     Nivia Colon, PA-C 08/09/24 2117

## 2024-08-09 NOTE — ED Notes (Signed)
 Patient given discharge and follow-up instructions. Patient verbalized understanding. Patient left via private vehicle.

## 2024-08-09 NOTE — ED Triage Notes (Signed)
 Pt presents via POV c/o fall c/o left lower leg pain. Reports fell on ice. Denies hitting head. Reports takes anti-coagulation. Ambulatory to triage. A&O x4.

## 2024-08-09 NOTE — ED Triage Notes (Signed)
 He states he has multiple myeloma and wants to make sure he didn't break anything because he has brittle bones.

## 2024-08-09 NOTE — ED Provider Notes (Signed)
 " [place] EMERGENCY DEPARTMENT AT [facility] Provider Note   CSN: [id number] Arrival date & time: 08/09/24  2051     Patient presents with: Kevin Lowery is a 64 y.o. male with history of diabetes, chronic systolic CHF, syncope and collapse, hypertension, multiple myeloma.  Presents to ED complaining of fall, left knee and left thigh pain.  Reports that he tripped and fell on ice prior to arrival.  He states that he does take blood thinners but he adamantly denies striking his head.  Denies headache, neck pain, lightheadedness or dizziness.  Denies preceding chest pain or shortness of breath.  Arrives complaining of pain in left hip, left knee that he states began after his fall.  He reports that he took Tylenol  in triage and his pain is greatly reduced after this.  He is concerned that he has broken bones as he reports he has history of multiple myeloma and has brittle bones.   Fall       Prior to Admission medications  Medication Sig Start Date End Date Taking? Authorizing Provider  allopurinol  (ZYLOPRIM ) 300 MG tablet Take 450 mg by mouth daily. 08/17/17   [provider]  carvedilol (COREG) 25 MG tablet Take 1.5 tablets by mouth in the morning and at bedtime. 12/28/20   [provider]  cyanocobalamin  1000 MCG tablet Take 1,000 mcg by mouth daily.    [provider]  diclofenac  Sodium (VOLTAREN ) 1 % GEL Apply 4 g topically 4 (four) times daily. 01/09/24   Emil Share, DO  DULoxetine  (CYMBALTA ) 60 MG capsule Take 60 mg by mouth daily. 01/28/21   [provider]  ENTRESTO 49-51 MG Take 1 tablet by mouth 2 (two) times daily. 01/21/21   [provider]  furosemide (LASIX) 20 MG tablet Take 20 mg by mouth daily. 01/21/21   [provider]  glipiZIDE  (GLUCOTROL ) 5 MG tablet Take 1 tablet (5 mg total) by mouth 2 (two) times daily before a meal. 12/24/21   Jens Durand, MD  guaiFENesin  (MUCINEX ) 600 MG 12 hr tablet Take 1  tablet (600 mg total) by mouth 2 (two) times daily as needed for cough. 07/13/22   Jacolyn Pae, MD  JARDIANCE 10 MG TABS tablet Take 10 mg by mouth daily. 02/22/21   [provider]  metFORMIN  (GLUCOPHAGE -XR) 750 MG 24 hr tablet Take 1 tablet (750 mg total) by mouth daily with breakfast. 12/25/21   Jens Durand, MD  pantoprazole  (PROTONIX ) 40 MG tablet Take 40 mg by mouth daily. 01/31/21   [provider]  POMALYST 3 MG capsule Take by mouth. 02/19/21   [provider]  potassium chloride SA (KLOR-CON) 20 MEQ tablet Take 20 mEq by mouth 2 (two) times daily. 12/30/20   [provider]  pregabalin  (LYRICA ) 150 MG capsule Take 150 mg by mouth 3 (three) times daily. 03/11/21   [provider]  pseudoephedrine  (SUDAFED) 60 MG tablet Take 1 tablet (60 mg total) by mouth every 6 (six) hours as needed for congestion. 07/13/22   Jacolyn Pae, MD  RIVAROXABAN  (XARELTO ) VTE STARTER PACK (15 & 20 MG) Follow package directions: Take one 15mg  tablet by mouth twice a day. On day 22, switch to one 20mg  tablet once a day. Take with food. 01/09/24   Emil Share, DO  spironolactone (ALDACTONE) 25 MG tablet Take 25 mg by mouth daily. 01/18/21   [provider]  XARELTO  20 MG TABS tablet Take 20 mg by mouth daily  with supper. 01/23/21   [provider]    Allergies: Hydrocodone and Oxycodone    Review of Systems  All other systems reviewed and are negative.   Updated Vital Signs BP 103/72 (BP Location: Left Arm)   Pulse (!) 52   Temp (!) 97.2 F (36.2 C) (Oral)   Resp 19   Ht 5' 9 (1.753 m)   Wt 108.9 kg   SpO2 100%   BMI 35.44 kg/m   Physical Exam Vitals and nursing note reviewed.  Constitutional:      General: He is not in acute distress.    Appearance: He is well-developed.  HENT:     Head: Normocephalic and atraumatic.  Eyes:     Conjunctiva/sclera: Conjunctivae normal.  Cardiovascular:     Rate and Rhythm: Normal rate and  regular rhythm.     Heart sounds: No murmur heard. Pulmonary:     Effort: Pulmonary effort is normal. No respiratory distress.     Breath sounds: Normal breath sounds.  Abdominal:     Palpations: Abdomen is soft.     Tenderness: There is no abdominal tenderness.  Musculoskeletal:        General: No swelling.     Cervical back: Neck supple.     Comments: No obvious deformity to left knee.  Full ROM left knee.  Tenderness to medial portion of left knee without overlying skin change.  Left hip with full ROM, no deformity, no shortening or rotation of left lower extremity.  No tenderness to lateral hip.  Skin:    General: Skin is warm and dry.     Capillary Refill: Capillary refill takes less than 2 seconds.  Neurological:     Mental Status: He is alert and oriented to person, place, and time. Mental status is at baseline.  Psychiatric:        Mood and Affect: Mood normal.     (all labs ordered are listed, but only abnormal results are displayed) Labs Reviewed - No data to display  EKG: None  Radiology: DG Knee Complete 4 Views Left Result Date: 08/09/2024 EXAM: 4 OR MORE VIEW(S) XRAY OF THE LEFT KNEE 08/09/2024 09:44:00 PM COMPARISON: None available. CLINICAL HISTORY: Patient fell. FINDINGS: BONES AND JOINTS: No acute fracture. No malalignment. No significant joint effusion. Superior and inferior patellar osteophytes. 2.2 cm sclerotic focus along the posteromedial distal femoral shaft, stable from 01/09/2024 and favored to represent a fibrocortical defect. Mild medial and patellofemoral degenerative changes. Degenerative enthesopathy involving the insertion of the quadriceps and patellar tendons upon the patella. SOFT TISSUES: Unremarkable. IMPRESSION: 1. No evidence of acute traumatic injury. 2. 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 3. degenerative enthesopathy at the quadriceps and patellar tendon insertions on the patella. 4. Stable 2.2 cm sclerotic focus  along the posteromedial distal femoral shaft, favored to represent a fibrocortical defect. Electronically signed by: Dorethia Molt MD 08/09/2024 09:53 PM EST RP Workstation: [id number]   DG Femur Min 2 Views Left Result Date: 08/09/2024 EXAM: 2 VIEW(S) XRAY OF THE LEFT FEMUR 08/09/2024 09:44:00 PM COMPARISON: Prior examination of 01/09/2024. CLINICAL HISTORY: Patient fell. FINDINGS: BONES AND JOINTS: No acute fracture. 2.9 cm Focal cortical thickening along the medial distal femoral diaphysis. This is stable from prior examination of 01/09/2024 and a benign lesion such as a nonossifying fibroma (fibrocortical defect) is favored. Mild left hip and bicompartmental left knee arthritis. SOFT TISSUES: Vascular calcifications. IMPRESSION: 1. No evidence of acute traumatic injury. 2. Stable benign fibrocortical defect of  the distal femur. 3. Vascular calcifications. Electronically signed by: Dorethia Molt MD 08/09/2024 09:51 PM EST RP Workstation: [id number]    Procedures   Medications Ordered in the ED  acetaminophen  (TYLENOL ) tablet 1,000 mg (1,000 mg Oral Given 08/09/24 2118)     Medical Decision Making  This is a 64 year old male presenting to the ED due to fall.  On exam, HD stable.  Lung sounds clear bilaterally, no hypoxia.  Abdomen soft and compressible.  Neuroexam at baseline.  Overall patient nontoxic in appearance.  Full ROM left hip, left knee.  No deformity.  Patient reports Tylenol  has reduced pain.  X-ray imaging collected in triage and patient left femur, left knee.  Left femur x-ray shows no evidence of acute traumatic injury.  There is stable benign fiber cortical defect of the distal femur.  Patient plan for imaging of left knee reveals no evidence of acute traumatic injury, mild medial and patellofemoral compartment degenerative arthritis, degenerative enthesopathy at the quadriceps and patellar tendon insertions on the patella.  No acute fracture identified on either film.  Patient  offered brace, Ace bandage but defers.  Patient was counseled on RICE therapy and voiced understanding.  He was advised to continue taking Tylenol  at home for pain.  He voiced understanding.  All questions answered to patient satisfaction.  Stable to discharge.    Final diagnoses:  Fall, initial encounter  Acute pain of left knee    ED Discharge Orders     None          Ruthell Lonni [place], [place] 08/09/24 2312  "

## 2024-08-09 NOTE — Discharge Instructions (Signed)
 As discussed, your x-rays tonight showed no fracture.  Please follow-up with Dr. Celena if your knee continues to give you pain.  You may purchase a knee brace or Ace bandage at pharmacy to compress your left knee.  Please follow RICE therapy as we discussed.  This stands for rest, ice, compression and elevation.  You may take Tylenol  every 6 hours as needed for pain.  Return to ED with new symptoms.
